# Patient Record
Sex: Male | Born: 1955 | Race: White | Hispanic: No | Marital: Married | Smoking: Never smoker
Health system: Southern US, Community
[De-identification: ages and names within clinical notes are randomized; demographics above are authoritative.]

## PROBLEM LIST (undated history)

## (undated) DIAGNOSIS — T8859XA Other complications of anesthesia, initial encounter: Secondary | ICD-10-CM

## (undated) DIAGNOSIS — I10 Essential (primary) hypertension: Secondary | ICD-10-CM

## (undated) DIAGNOSIS — T7840XA Allergy, unspecified, initial encounter: Secondary | ICD-10-CM

## (undated) DIAGNOSIS — D494 Neoplasm of unspecified behavior of bladder: Secondary | ICD-10-CM

## (undated) DIAGNOSIS — L719 Rosacea, unspecified: Secondary | ICD-10-CM

## (undated) DIAGNOSIS — E785 Hyperlipidemia, unspecified: Secondary | ICD-10-CM

## (undated) DIAGNOSIS — Z973 Presence of spectacles and contact lenses: Secondary | ICD-10-CM

## (undated) HISTORY — PX: UPPER GASTROINTESTINAL ENDOSCOPY: SHX188

## (undated) HISTORY — PX: TONSILLECTOMY: SUR1361

## (undated) HISTORY — DX: Allergy, unspecified, initial encounter: T78.40XA

## (undated) HISTORY — PX: OTHER SURGICAL HISTORY: SHX169

## (undated) HISTORY — DX: Hyperlipidemia, unspecified: E78.5

## (undated) HISTORY — DX: Rosacea, unspecified: L71.9

## (undated) HISTORY — DX: Essential (primary) hypertension: I10

## (undated) HISTORY — PX: COLONOSCOPY: SHX174

## (undated) HISTORY — PX: PILONIDAL CYST EXCISION: SHX744

---

## 1998-12-26 ENCOUNTER — Ambulatory Visit (HOSPITAL_COMMUNITY): Admission: RE | Admit: 1998-12-26 | Discharge: 1998-12-26 | Payer: Self-pay | Admitting: Neurosurgery

## 1998-12-26 ENCOUNTER — Encounter: Payer: Self-pay | Admitting: Neurosurgery

## 1999-01-03 ENCOUNTER — Encounter: Payer: Self-pay | Admitting: Neurosurgery

## 1999-01-05 ENCOUNTER — Inpatient Hospital Stay (HOSPITAL_COMMUNITY): Admission: RE | Admit: 1999-01-05 | Discharge: 1999-01-06 | Payer: Self-pay | Admitting: Neurosurgery

## 1999-01-05 ENCOUNTER — Encounter: Payer: Self-pay | Admitting: Neurosurgery

## 2000-08-14 DIAGNOSIS — Z87442 Personal history of urinary calculi: Secondary | ICD-10-CM

## 2000-08-14 HISTORY — DX: Personal history of urinary calculi: Z87.442

## 2002-08-14 HISTORY — PX: UPPER GASTROINTESTINAL ENDOSCOPY: SHX188

## 2002-12-25 ENCOUNTER — Encounter: Admission: RE | Admit: 2002-12-25 | Discharge: 2002-12-25 | Payer: Self-pay | Admitting: Family Medicine

## 2002-12-25 ENCOUNTER — Encounter: Payer: Self-pay | Admitting: Family Medicine

## 2004-05-25 ENCOUNTER — Encounter: Admission: RE | Admit: 2004-05-25 | Discharge: 2004-05-25 | Payer: Self-pay | Admitting: Family Medicine

## 2008-05-21 ENCOUNTER — Ambulatory Visit: Payer: Self-pay | Admitting: Gastroenterology

## 2008-06-01 ENCOUNTER — Telehealth: Payer: Self-pay | Admitting: Gastroenterology

## 2008-06-03 ENCOUNTER — Ambulatory Visit: Payer: Self-pay | Admitting: Gastroenterology

## 2010-09-15 NOTE — Miscellaneous (Signed)
Summary: RECALL COL '08.Marland KitchenEM  Clinical Lists Changes  Medications: Added new medication of MOVIPREP 100 GM  SOLR (PEG-KCL-NACL-NASULF-NA ASC-C) As per prep instructions. - Signed Rx of MOVIPREP 100 GM  SOLR (PEG-KCL-NACL-NASULF-NA ASC-C) As per prep instructions.;  #1 x 0;  Signed;  Entered by: Clide Cliff RN;  Authorized by: Mardella Layman MD [facility];  Method used: Electronically to [facility]*, [address] [place], [place]  [id number], Ph: [phone], Fax: [phone] Observations: Added new observation of NKA: T (05/21/2008 8:01)    Prescriptions: MOVIPREP 100 GM  SOLR (PEG-KCL-NACL-NASULF-NA ASC-C) As per prep instructions.  #1 x 0   Entered by:   Clide Cliff RN   Authorized by:   Mardella Layman MD [facility]   Signed by:   Clide Cliff RN on 05/21/2008   Method used:   Electronically to        [facility]* (retail)       [address]       [place], [place]  [id number]       Ph: [phone]       Fax: [phone]   RxID:   [id number]

## 2010-09-15 NOTE — Progress Notes (Signed)
Summary: ? MEDS  Phone Note Call from Patient Call back at [phone]   Caller: Patient Call For: PATTERSON Reason for Call: Talk to Nurse Details for Reason: ? MEDS Summary of Call: proc sch on Wed pt has ? re: his meds Initial call taken by: Guadlupe Spanish [facility],  June 01, 2008 10:06 AM  Follow-up for Phone Call        pt questioned if he should stop taking his lipitor and lisinopril. I advised him to continue taking those meds and that he should take them the day of the procedure, just NPO after 9am so take them before. Follow-up by: Harlow Mares CMA,  June 01, 2008 10:11 AM

## 2010-09-15 NOTE — Procedures (Signed)
Summary: Colonoscopy   Colonoscopy  Procedure date:  06/03/2008  Findings:      Location:  [facility].    Procedures Next Due Date:    Colonoscopy: 06/2018  Patient Name: [name] [name]. MRN:  Procedure Procedures: Colonoscopy CPT: [id number].  Personnel: Endoscopist: Vania Rea. Jarold Motto, MD.  Exam Location: Exam performed in Outpatient Clinic. Outpatient  Patient Consent: Procedure, Alternatives, Risks and Benefits discussed, consent obtained, from patient. Consent was obtained by the RN.  Indications  Average Risk Screening Routine.  History  Current Medications: Patient is not currently taking Coumadin.  Medical/ Surgical History: Hypertension,  Pre-Exam Physical: Performed Jun 03, 2008. Cardio-pulmonary exam, Rectal exam, Abdominal exam, Extremity exam, Mental status exam WNL.  Comments: Pt. history reviewed/updated, physical exam performed prior to initiation of sedation? yes Exam Exam: Extent of exam reached: Cecum, extent intended: Cecum.  The cecum was identified by appendiceal orifice and IC valve. Patient position: on left side. Time to Cecum: 00:04:06. Time for Withdrawl: 00:05:05. Colon retroflexion performed. Images taken. ASA Classification: II. Tolerance: excellent.  Monitoring: Pulse and BP monitoring, Oximetry used. Supplemental O2 given. at 2 Liters.  Colon Prep Used Golytely for colon prep. Prep results: excellent.  Sedation Meds: Patient assessed and found to be appropriate for moderate (conscious) sedation. Sedation was managed by the Endoscopist. Fentanyl 50 mcg. given IV. Versed 6 mg. given IV.  Instrument(s): CF 140L. Serial [id number].  Findings - NORMAL EXAM: Cecum to Rectum. Not Seen: Polyps. AVM's. Colitis. Tumors. Melanosis. Crohn's. Diverticulosis. Hemorrhoids.   Assessment Normal examination.  Events  Unplanned Interventions: No intervention was required.  Plans Medication Plan: Referring provider to order  medications.  Patient Education: Patient given standard instructions for: Patient instructed to get routine colonoscopy every 10 years.  Disposition: After procedure patient sent to recovery. After recovery patient sent home.  Scheduling/Referral: Follow-Up prn.    cc: Hall Busing, MD  This report was created from the original endoscopy report, which was reviewed and signed by the above listed endoscopist.

## 2011-11-09 ENCOUNTER — Encounter: Payer: Self-pay | Admitting: Gastroenterology

## 2013-04-29 ENCOUNTER — Other Ambulatory Visit: Payer: Self-pay | Admitting: Family Medicine

## 2013-04-29 ENCOUNTER — Ambulatory Visit
Admission: RE | Admit: 2013-04-29 | Discharge: 2013-04-29 | Disposition: A | Discharge status: Home / Self Care | Payer: BC Managed Care – PPO | Source: Ambulatory Visit | Attending: Family Medicine | Admitting: Family Medicine

## 2013-04-29 DIAGNOSIS — M542 Cervicalgia: Secondary | ICD-10-CM

## 2013-06-18 ENCOUNTER — Ambulatory Visit: Payer: 59 | Attending: Family Medicine

## 2013-06-18 DIAGNOSIS — R5381 Other malaise: Secondary | ICD-10-CM | POA: Insufficient documentation

## 2013-06-18 DIAGNOSIS — M542 Cervicalgia: Secondary | ICD-10-CM | POA: Insufficient documentation

## 2013-06-18 DIAGNOSIS — IMO0001 Reserved for inherently not codable concepts without codable children: Secondary | ICD-10-CM | POA: Insufficient documentation

## 2013-06-20 ENCOUNTER — Ambulatory Visit: Payer: 59 | Admitting: Physical Therapy

## 2013-06-25 ENCOUNTER — Ambulatory Visit: Payer: 59 | Admitting: Physical Therapy

## 2013-06-27 ENCOUNTER — Ambulatory Visit: Payer: 59 | Admitting: Physical Therapy

## 2013-07-01 ENCOUNTER — Ambulatory Visit: Payer: 59

## 2013-07-04 ENCOUNTER — Encounter: Payer: 59 | Admitting: Physical Therapy

## 2013-07-07 ENCOUNTER — Ambulatory Visit: Payer: 59

## 2013-07-09 ENCOUNTER — Ambulatory Visit: Payer: 59 | Admitting: Physical Therapy

## 2013-07-14 ENCOUNTER — Ambulatory Visit: Payer: 59 | Attending: Family Medicine

## 2013-07-14 DIAGNOSIS — R5381 Other malaise: Secondary | ICD-10-CM | POA: Insufficient documentation

## 2013-07-14 DIAGNOSIS — IMO0001 Reserved for inherently not codable concepts without codable children: Secondary | ICD-10-CM | POA: Insufficient documentation

## 2013-07-14 DIAGNOSIS — M542 Cervicalgia: Secondary | ICD-10-CM | POA: Insufficient documentation

## 2013-07-16 ENCOUNTER — Ambulatory Visit: Payer: 59

## 2013-07-23 ENCOUNTER — Ambulatory Visit: Payer: 59 | Admitting: Physical Therapy

## 2013-07-24 ENCOUNTER — Ambulatory Visit: Payer: 59

## 2013-07-28 ENCOUNTER — Ambulatory Visit: Payer: 59

## 2013-07-30 ENCOUNTER — Ambulatory Visit: Payer: 59 | Admitting: Physical Therapy

## 2015-01-25 IMAGING — CR DG CERVICAL SPINE COMPLETE 4+V
6 series · 6 of 6 positions shown · non-contrast
Comparison: None.

***ADDENDUM*** CREATED: 04/29/2013 [DATE]

The anterior cervical fusion extends through T1 level.
***END ADDENDUM*** SIGNED BY: Kae Rehm
CLINICAL DATA: History of previous injury with pain. Injury on
04/25/2013.  History of previous spinal surgery.
CERVICAL SPINE - COMPLETE 4+ VIEW

[w c-spine lat]
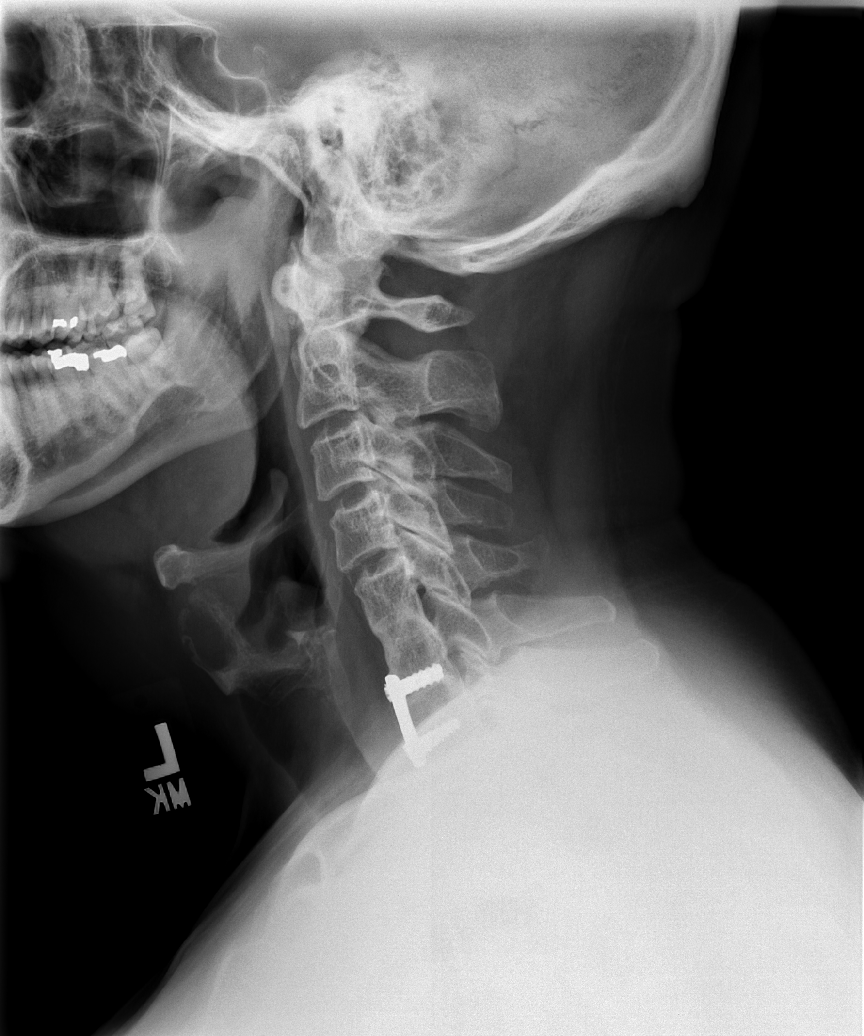

[w c-spine oblique (1 of 2)]
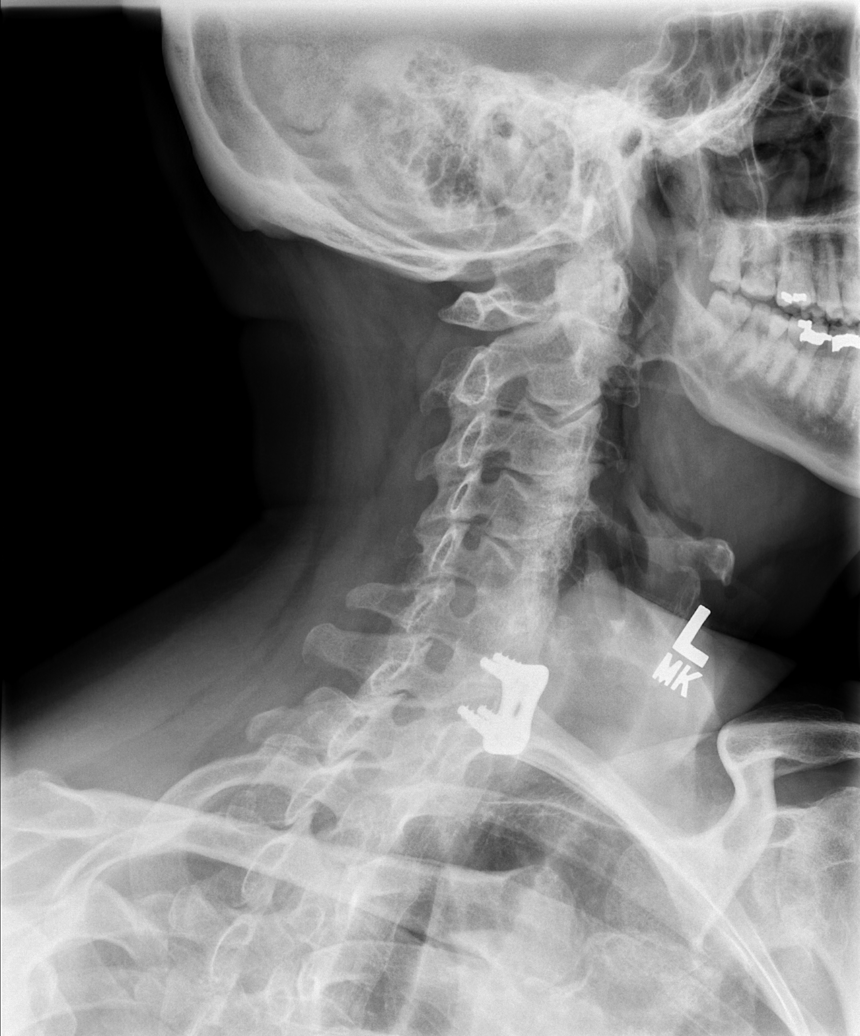

[w c-spine oblique (2 of 2)]
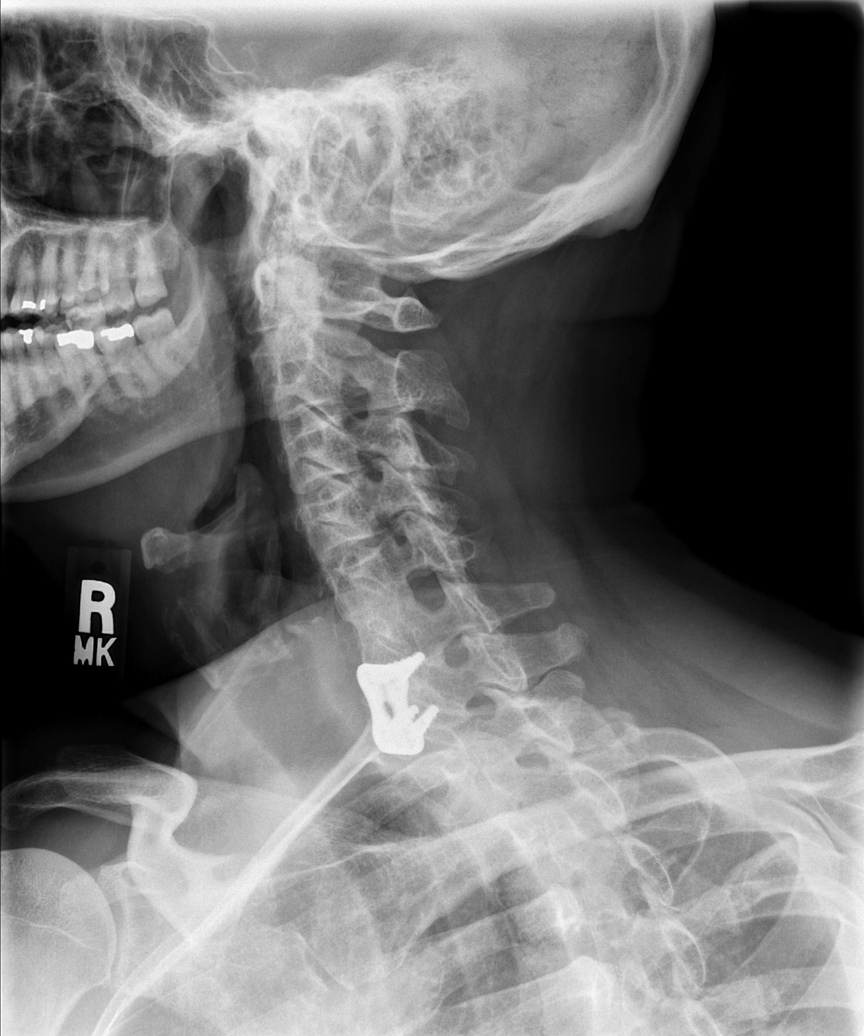

[w c-spine a.p. *]
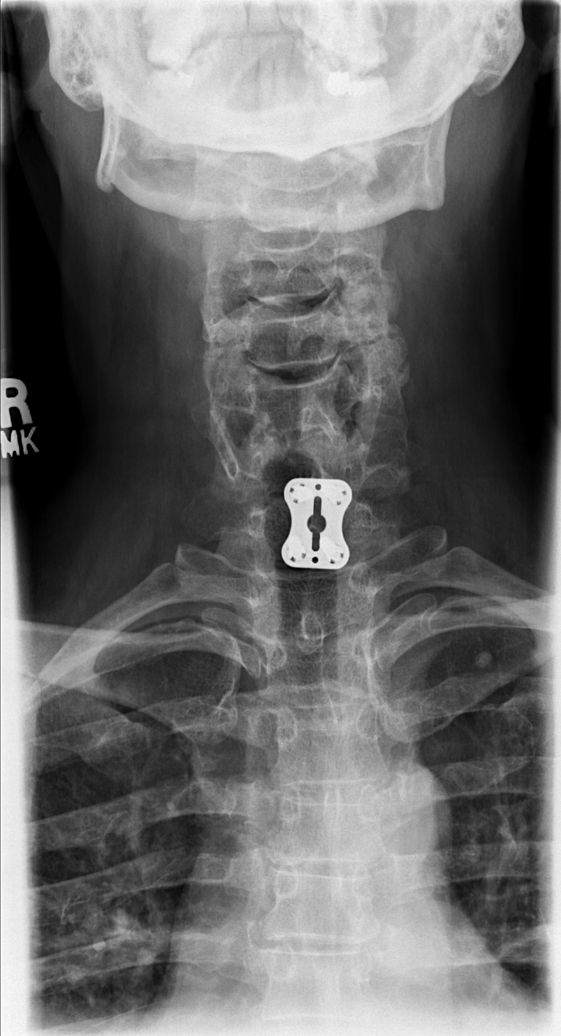

[w c-spine odontoid *]
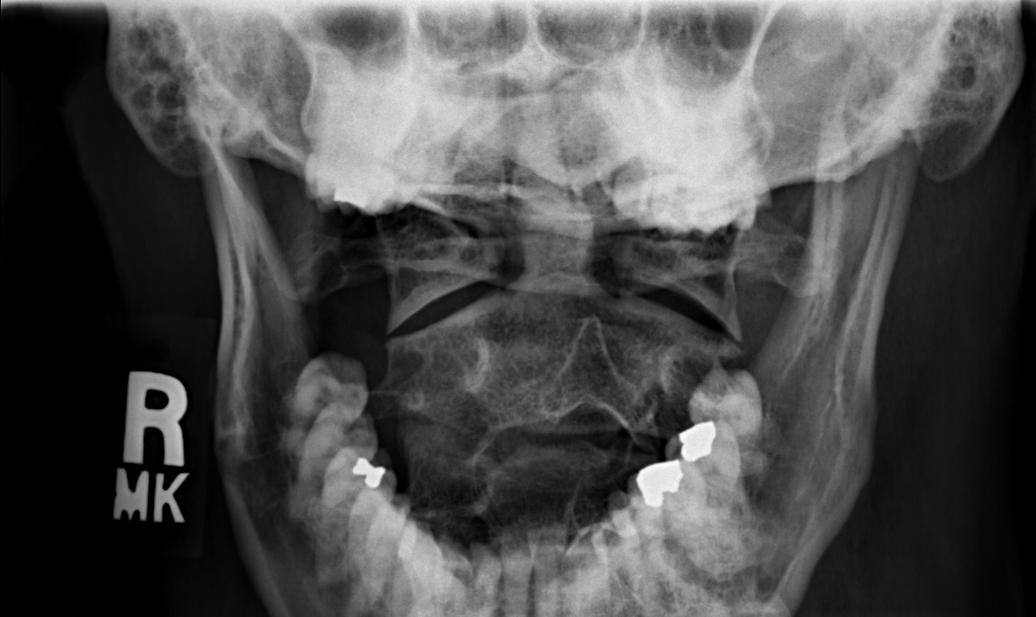

[w swimmers view *]
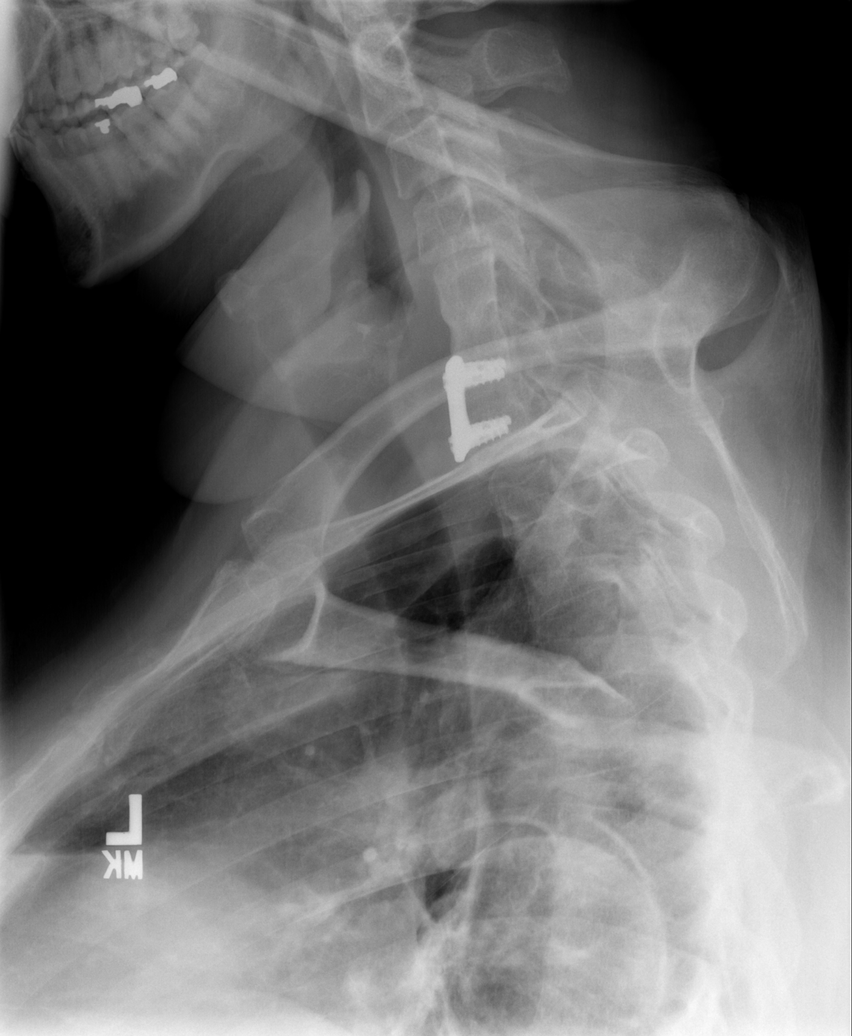

[6 of 6 positions shown; findings below may reference images not displayed]

FINDINGS: No prevertebral soft tissue swelling is seen.  There is
fusion of vertebral bodies extending from C5-C7.  There is no
evidence of disruption of the metallic hardware.  There is no
evidence of fracture.  No subluxation or dislocation is evident.
Intervertebral disc spaces are preserved at levels of C2-C3, C3-C4,
and C4-C5.  There is uncovertebral spurring with minimal foraminal
encroachment at levels of C3-C4 and C4-C5 on the right and
minimally on the left.  There is some apophyseal joint degenerative
spondylosis.  No cervical rib is evident.
IMPRESSION: No evidence of fracture, subluxation, or dislocation.  Stable
appearance of vertebral fusion from C5-C7.  No disruption of
hardware is evident.  Degenerative spondylosis changes are
described above.

## 2015-07-23 ENCOUNTER — Encounter: Payer: Self-pay | Admitting: Gastroenterology

## 2017-11-27 ENCOUNTER — Encounter: Payer: Self-pay | Admitting: Gastroenterology

## 2018-05-17 ENCOUNTER — Encounter: Payer: Self-pay | Admitting: Gastroenterology

## 2018-06-18 ENCOUNTER — Encounter: Payer: Self-pay | Admitting: Gastroenterology

## 2018-06-18 ENCOUNTER — Ambulatory Visit (AMBULATORY_SURGERY_CENTER): Payer: Self-pay

## 2018-06-18 VITALS — Ht 70.0 in | Wt 243.3 lb

## 2018-06-18 DIAGNOSIS — Z1211 Encounter for screening for malignant neoplasm of colon: Secondary | ICD-10-CM

## 2018-06-18 MED ORDER — NA SULFATE-K SULFATE-MG SULF 17.5-3.13-1.6 GM/177ML PO SOLN: 1.0000 | Freq: Once | ORAL | 0 refills | Status: AC

## 2018-06-18 NOTE — Progress Notes (Signed)
Denies allergies to eggs or soy products. Denies complication of anesthesia or sedation. Denies use of weight loss medication. Denies use of O2.   Emmi instructions declined.   A pay no more than 50.00 coupon for Suprep was given to the patient.  

## 2018-07-02 ENCOUNTER — Ambulatory Visit (AMBULATORY_SURGERY_CENTER): Payer: PRIVATE HEALTH INSURANCE | Admitting: Gastroenterology

## 2018-07-02 ENCOUNTER — Encounter: Payer: Self-pay | Admitting: Gastroenterology

## 2018-07-02 VITALS — BP 126/75 | HR 58 | Temp 97.5°F | Resp 16 | Ht 70.0 in | Wt 243.0 lb

## 2018-07-02 DIAGNOSIS — D122 Benign neoplasm of ascending colon: Secondary | ICD-10-CM | POA: Diagnosis not present

## 2018-07-02 DIAGNOSIS — D124 Benign neoplasm of descending colon: Secondary | ICD-10-CM | POA: Diagnosis not present

## 2018-07-02 DIAGNOSIS — D123 Benign neoplasm of transverse colon: Secondary | ICD-10-CM

## 2018-07-02 DIAGNOSIS — D125 Benign neoplasm of sigmoid colon: Secondary | ICD-10-CM

## 2018-07-02 DIAGNOSIS — K635 Polyp of colon: Secondary | ICD-10-CM

## 2018-07-02 DIAGNOSIS — D128 Benign neoplasm of rectum: Secondary | ICD-10-CM

## 2018-07-02 DIAGNOSIS — Z1211 Encounter for screening for malignant neoplasm of colon: Secondary | ICD-10-CM

## 2018-07-02 DIAGNOSIS — D120 Benign neoplasm of cecum: Secondary | ICD-10-CM

## 2018-07-02 MED ORDER — SODIUM CHLORIDE 0.9 % IV SOLN: 500.0000 mL | Freq: Once | INTRAVENOUS | Status: DC

## 2018-07-02 NOTE — Progress Notes (Signed)
Report given to PACU, vss 

## 2018-07-02 NOTE — Patient Instructions (Signed)
YOU HAD AN ENDOSCOPIC PROCEDURE TODAY AT THE [facility]:   Refer to the procedure report that was given to you for any specific questions about what was found during the examination.  If the procedure report does not answer your questions, please call your gastroenterologist to clarify.  If you requested that your care partner not be given the details of your procedure findings, then the procedure report has been included in a sealed envelope for you to review at your convenience later.  YOU SHOULD EXPECT: Some feelings of bloating in the abdomen. Passage of more gas than usual.  Walking can help get rid of the air that was put into your GI tract during the procedure and reduce the bloating. If you had a lower endoscopy (such as a colonoscopy or flexible sigmoidoscopy) you may notice spotting of blood in your stool or on the toilet paper. If you underwent a bowel prep for your procedure, you may not have a normal bowel movement for a few days.  Please Note:  You might notice some irritation and congestion in your nose or some drainage.  This is from the oxygen used during your procedure.  There is no need for concern and it should clear up in a day or so.  SYMPTOMS TO REPORT IMMEDIATELY:   Following lower endoscopy (colonoscopy or flexible sigmoidoscopy):  Excessive amounts of blood in the stool  Significant tenderness or worsening of abdominal pains  Swelling of the abdomen that is new, acute  Fever of 100F or higher   For urgent or emergent issues, a gastroenterologist can be reached at any hour by calling [phone].   DIET:  We do recommend a small meal at first, but then you may proceed to your regular diet.  Drink plenty of fluids but you should avoid alcoholic beverages for 24 hours.  ACTIVITY:  You should plan to take it easy for the rest of today and you should NOT DRIVE or use heavy machinery until tomorrow (because of the sedation medicines used during the test).     FOLLOW UP: Our staff will call the number listed on your records the next business day following your procedure to check on you and address any questions or concerns that you may have regarding the information given to you following your procedure. If we do not reach you, we will leave a message.  However, if you are feeling well and you are not experiencing any problems, there is no need to return our call.  We will assume that you have returned to your regular daily activities without incident.  If any biopsies were taken you will be contacted by phone or by letter within the next 1-3 weeks.  Please call us at [phone] if you have not heard about the biopsies in 3 weeks.    SIGNATURES/CONFIDENTIALITY: You and/or your care partner have signed paperwork which will be entered into your electronic medical record.  These signatures attest to the fact that that the information above on your After Visit Summary has been reviewed and is understood.  Full responsibility of the confidentiality of this discharge information lies with you and/or your care-partner.    Handouts were given to your care partner on polyps and hemorrhoids. You may resume your current medications today. Await biopsy results. Please call if any questions or concerns.   

## 2018-07-02 NOTE — Op Note (Addendum)
[place] Patient Name: [name] [name] Procedure Date: 07/02/2018 9:03 AM MRN: [id number] Endoscopist: Remo Lipps P. Havery Moros , MD Age: 62 Referring MD:  Date of Birth: 1956/03/01 Gender: Male Account #: [id number] Procedure:                Colonoscopy Indications:              Screening for colorectal malignant neoplasm Medicines:                Monitored Anesthesia Care Procedure:                Pre-Anesthesia Assessment:                           - Prior to the procedure, a History and Physical                            was performed, and patient medications and                            allergies were reviewed. The patient's tolerance of                            previous anesthesia was also reviewed. The risks                            and benefits of the procedure and the sedation                            options and risks were discussed with the patient.                            All questions were answered, and informed consent                            was obtained. Prior Anticoagulants: The patient has                            taken no previous anticoagulant or antiplatelet                            agents. ASA Grade Assessment: II - A patient with                            mild systemic disease. After reviewing the risks                            and benefits, the patient was deemed in                            satisfactory condition to undergo the procedure.                           After obtaining informed consent, the colonoscope  was passed under direct vision. Throughout the                            procedure, the patient's blood pressure, pulse, and                            oxygen saturations were monitored continuously. The                            Colonoscope was introduced through the anus and                            advanced to the the cecum, identified by                            appendiceal orifice and  ileocecal valve. The                            colonoscopy was performed without difficulty. The                            patient tolerated the procedure well. The quality                            of the bowel preparation was good. The ileocecal                            valve, appendiceal orifice, and rectum were                            photographed. Scope In: 9:08:00 AM Scope Out: 9:41:17 AM Scope Withdrawal Time: 0 hours 29 minutes 33 seconds  Total Procedure Duration: 0 hours 33 minutes 17 seconds  Findings:                 The perianal and digital rectal examinations were                            normal.                           Two sessile polyps were found in the cecum. The                            polyps were 3 to 4 mm in size. These polyps were                            removed with a cold snare. Resection and retrieval                            were complete.                           Two sessile polyps were found in the ascending  colon. The polyps were 3 mm in size. These polyps                            were removed with a cold snare. Resection and                            retrieval were complete.                           A 3 mm polyp was found in the hepatic flexure. The                            polyp was sessile and could not be grasped with the                            snare. The polyp was removed with a cold biopsy                            forceps. Resection and retrieval were complete.                           Three sessile polyps were found in the transverse                            colon. The polyps were 3 to 5 mm in size. These                            polyps were removed with a cold snare. Resection                            and retrieval were complete.                           Two sessile polyps were found in the splenic                            flexure. The polyps were 3 mm in size. These polyps                             were removed with a cold snare. Resection and                            retrieval were complete.                           A 3 mm polyp was found in the descending colon. The                            polyp was sessile. The polyp was removed with a                            cold snare. Resection and retrieval were complete.  A 3 mm polyp was found in the sigmoid colon. The                            polyp was sessile. The polyp was removed with a                            cold snare. Resection and retrieval were complete.                           A 3 mm polyp was found in the rectum. The polyp was                            sessile. The polyp was removed with a cold snare.                            Resection and retrieval were complete.                           Internal hemorrhoids were found during retroflexion.                           The exam was otherwise without abnormality. Complications:            No immediate complications. Estimated blood loss:                            Minimal. Estimated Blood Loss:     Estimated blood loss was minimal. Impression:               - Two 3 to 4 mm polyps in the cecum, removed with a                            cold snare. Resected and retrieved.                           - Two 3 mm polyps in the ascending colon, removed                            with a cold snare. Resected and retrieved.                           - One 3 mm polyp at the hepatic flexure, removed                            with a cold biopsy forceps. Resected and retrieved.                           - Three 3 to 5 mm polyps in the transverse colon,                            removed with a cold snare. Resected and retrieved.                           -  Two 3 mm polyps at the splenic flexure, removed                            with a cold snare. Resected and retrieved.                           - One 3 mm polyp in the descending  colon, removed                            with a cold snare. Resected and retrieved.                           - One 3 mm polyp in the sigmoid colon, removed with                            a cold snare. Resected and retrieved.                           - One 3 mm polyp in the rectum, removed with a cold                            snare. Resected and retrieved.                           - Internal hemorrhoids.                           - The examination was otherwise normal. Recommendation:           - Patient has a contact number available for                            emergencies. The signs and symptoms of potential                            delayed complications were discussed with the                            patient. Return to normal activities tomorrow.                            Written discharge instructions were provided to the                            patient.                           - Resume previous diet.                           - Continue present medications.                           - Await pathology results. Remo Lipps P. Armbruster, MD 07/02/2018 9:46:54 AM This report has been signed electronically.

## 2018-07-02 NOTE — Progress Notes (Signed)
Called to room to assist during endoscopic procedure.  Patient ID and intended procedure confirmed with present staff. Received instructions for my participation in the procedure from the performing physician.  

## 2018-07-02 NOTE — Progress Notes (Signed)
Pt's states no medical or surgical changes since previsit or office visit. 

## 2018-07-02 NOTE — Progress Notes (Signed)
Pt is waiting to speak with Dr. Havery Moros d/t pt being asleep 2x that Dr. Havery Moros came in to speak with him about his results.  maw

## 2018-07-02 NOTE — Progress Notes (Signed)
No problems noted in the recovery room. maw 

## 2018-07-03 ENCOUNTER — Telehealth: Payer: Self-pay | Admitting: *Deleted

## 2018-07-03 NOTE — Telephone Encounter (Signed)
No answer, left message to call if questions or concerns. 

## 2018-07-03 NOTE — Telephone Encounter (Signed)
No answer, second call, left message to call if questions or concerns.

## 2018-07-08 ENCOUNTER — Encounter: Payer: Self-pay | Admitting: Gastroenterology

## 2019-06-12 ENCOUNTER — Encounter: Payer: Self-pay | Admitting: Gastroenterology

## 2019-06-25 ENCOUNTER — Encounter: Payer: Self-pay | Admitting: Gastroenterology

## 2019-07-15 HISTORY — PX: COLONOSCOPY: SHX174

## 2019-07-22 ENCOUNTER — Ambulatory Visit (AMBULATORY_SURGERY_CENTER): Payer: Medicare Other

## 2019-07-22 ENCOUNTER — Other Ambulatory Visit: Payer: Self-pay

## 2019-07-22 ENCOUNTER — Encounter: Payer: Self-pay | Admitting: Gastroenterology

## 2019-07-22 VITALS — Temp 96.0°F | Ht 70.0 in | Wt 237.6 lb

## 2019-07-22 DIAGNOSIS — Z1159 Encounter for screening for other viral diseases: Secondary | ICD-10-CM

## 2019-07-22 DIAGNOSIS — Z86010 Personal history of colonic polyps: Secondary | ICD-10-CM

## 2019-07-22 MED ORDER — NA SULFATE-K SULFATE-MG SULF 17.5-3.13-1.6 GM/177ML PO SOLN: 1.0000 | Freq: Once | ORAL | 0 refills | Status: AC

## 2019-07-22 NOTE — Progress Notes (Signed)

## 2019-07-31 ENCOUNTER — Other Ambulatory Visit: Payer: Self-pay | Admitting: Gastroenterology

## 2019-07-31 ENCOUNTER — Ambulatory Visit (INDEPENDENT_AMBULATORY_CARE_PROVIDER_SITE_OTHER): Payer: Medicare Other

## 2019-07-31 DIAGNOSIS — Z1159 Encounter for screening for other viral diseases: Secondary | ICD-10-CM

## 2019-07-31 LAB — SARS CORONAVIRUS 2 (TAT 6-24 HRS): SARS Coronavirus 2: NEGATIVE

## 2019-08-05 ENCOUNTER — Other Ambulatory Visit: Payer: Self-pay

## 2019-08-05 ENCOUNTER — Encounter: Payer: Self-pay | Admitting: Gastroenterology

## 2019-08-05 ENCOUNTER — Ambulatory Visit (AMBULATORY_SURGERY_CENTER): Payer: Medicare Other | Admitting: Gastroenterology

## 2019-08-05 VITALS — BP 126/78 | HR 59 | Temp 98.6°F | Resp 8 | Ht 70.0 in | Wt 237.6 lb

## 2019-08-05 DIAGNOSIS — Z86010 Personal history of colonic polyps: Secondary | ICD-10-CM

## 2019-08-05 DIAGNOSIS — D122 Benign neoplasm of ascending colon: Secondary | ICD-10-CM

## 2019-08-05 MED ORDER — SODIUM CHLORIDE 0.9 % IV SOLN: 500.0000 mL | Freq: Once | INTRAVENOUS | Status: DC

## 2019-08-05 NOTE — Op Note (Signed)
[place] Patient Name: [name] [name] Procedure Date: 08/05/2019 11:03 AM MRN: [id number] Endoscopist: Remo Lipps P. 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 The patient's tolerance of                            previous anesthesia was also reviewed. The risks                            and benefits of the procedure and the sedation                            options and risks were discussed with the patient.                            All questions were answered, and informed consent                            was obtained. Prior Anticoagulants: The patient has                            taken no previous anticoagulant or antiplatelet                            agents. ASA Grade Assessment: II - A patient with                            mild systemic disease. After reviewing the risks                            and benefits, the patient was deemed in                            satisfactory condition to undergo the procedure.  After obtaining informed consent, the colonoscope                            was passed under direct vision. Throughout the                            procedure, the patient's blood pressure, pulse, and                            oxygen saturations were monitored continuously. The                            Colonoscope was introduced through the anus and                            advanced to the the cecum,  identified by                            appendiceal orifice and ileocecal valve. The                            colonoscopy was performed without difficulty. The                            patient tolerated the procedure well. The quality                            of the bowel preparation was good. The ileocecal                            valve, appendiceal orifice, and rectum were                            photographed. Scope In: 11:07:35 AM Scope Out: 11:28:13 AM Scope Withdrawal Time: 0 hours 15 minutes 56 seconds  Total Procedure Duration: 0 hours 20 minutes 38 seconds  Findings:                 The perianal and digital rectal examinations were                            normal.                           A diminutive polyp was found in the ascending                            colon. The polyp was sessile. The polyp was removed                            with a cold snare. Resection and retrieval were                            complete.  A few small-mouthed diverticula were found in the                            sigmoid colon.                           Internal hemorrhoids were found during retroflexion.                           The exam was otherwise without abnormality. Complications:            No immediate complications. Estimated blood loss:                            Minimal. Estimated Blood Loss:     Estimated blood loss was minimal. Impression:               - One diminutive polyp in the ascending colon,                            removed with a cold snare. Resected and retrieved.                           - Diverticulosis in the sigmoid colon.                           - Internal hemorrhoids.                           - The examination was otherwise normal. Recommendation:           - Patient has a contact number available for                            emergencies. The signs and symptoms of potential                            delayed  complications were discussed with the                            patient. Return to normal activities tomorrow.                            Written discharge instructions were provided to the                            patient.                           - Resume previous diet.                           - Continue present medications.                           - Await pathology results with further  recommendations [name] [name]. [name] Schumacher, MD 08/05/2019 11:31:55 AM This report has been signed electronically.

## 2019-08-05 NOTE — Progress Notes (Signed)
Temp by JB, VS by CW  Pt's states no medical or surgical changes since previsit or office visit.

## 2019-08-05 NOTE — Progress Notes (Signed)
Report given to PACU, vss 

## 2019-08-05 NOTE — Patient Instructions (Signed)
Handouts given:  Hemorrhoids, diverticulosis, Polyps Continue current medications Resume previous diet Await pathology results    YOU HAD AN ENDOSCOPIC PROCEDURE TODAY AT [facility]:   Refer to the procedure report that was given to you for any specific questions about what was found during the examination.  If the procedure report does not answer your questions, please call your gastroenterologist to clarify.  If you requested that your care partner not be given the details of your procedure findings, then the procedure report has been included in a sealed envelope for you to review at your convenience later.  YOU SHOULD EXPECT: Some feelings of bloating in the abdomen. Passage of more gas than usual.  Walking can help get rid of the air that was put into your GI tract during the procedure and reduce the bloating. If you had a lower endoscopy (such as a colonoscopy or flexible sigmoidoscopy) you may notice spotting of blood in your stool or on the toilet paper. If you underwent a bowel prep for your procedure, you may not have a normal bowel movement for a few days.  Please Note:  You might notice some irritation and congestion in your nose or some drainage.  This is from the oxygen used during your procedure.  There is no need for concern and it should clear up in a day or so.  SYMPTOMS TO REPORT IMMEDIATELY:   Following lower endoscopy (colonoscopy or flexible sigmoidoscopy):  Excessive amounts of blood in the stool  Significant tenderness or worsening of abdominal pains  Swelling of the abdomen that is new, acute  Fever of 100F or higher  For urgent or emergent issues, a gastroenterologist can be reached at any hour by calling [phone].   DIET:  We do recommend a small meal at first, but then you may proceed to your regular diet.  Drink plenty of fluids but you should avoid alcoholic beverages for 24 hours.  ACTIVITY:  You should plan to take it easy for the  rest of today and you should NOT DRIVE or use heavy machinery until tomorrow (because of the sedation medicines used during the test).    FOLLOW UP: Our staff will call the number listed on your records 48-72 hours following your procedure to check on you and address any questions or concerns that you may have regarding the information given to you following your procedure. If we do not reach you, we will leave a message.  We will attempt to reach you two times.  During this call, we will ask if you have developed any symptoms of COVID 19. If you develop any symptoms (ie: fever, flu-like symptoms, shortness of breath, cough etc.) before then, please call [phone].  If you test positive for Covid 19 in the 2 weeks post procedure, please call and report this information to [place].    If any biopsies were taken you will be contacted by phone or by letter within the next 1-3 weeks.  Please call us at [phone] if you have not heard about the biopsies in 3 weeks.    SIGNATURES/CONFIDENTIALITY: You and/or your care partner have signed paperwork which will be entered into your electronic medical record.  These signatures attest to the fact that that the information above on your After Visit Summary has been reviewed and is understood.  Full responsibility of the confidentiality of this discharge information lies with you and/or your care-partner.

## 2019-08-05 NOTE — Progress Notes (Signed)
Called to room to assist during endoscopic procedure.  Patient ID and intended procedure confirmed with present staff. Received instructions for my participation in the procedure from the performing physician.  

## 2019-08-07 ENCOUNTER — Telehealth: Payer: Self-pay | Admitting: *Deleted

## 2019-08-07 NOTE — Telephone Encounter (Signed)
No answer for post procedure call back. Left message for patient to call with questions or concerns. 

## 2019-08-07 NOTE — Telephone Encounter (Signed)
  Follow up Call-  Call back number 08/05/2019 07/02/2018  Post procedure Call Back phone  # [phone]  Permission to leave phone message Yes Yes  Some recent data might be hidden     No answer at # given.  Lm on VM

## 2019-08-14 ENCOUNTER — Encounter: Payer: Self-pay | Admitting: Gastroenterology

## 2020-09-01 DIAGNOSIS — D485 Neoplasm of uncertain behavior of skin: Secondary | ICD-10-CM | POA: Diagnosis not present

## 2020-09-01 DIAGNOSIS — L821 Other seborrheic keratosis: Secondary | ICD-10-CM | POA: Diagnosis not present

## 2020-09-01 DIAGNOSIS — L570 Actinic keratosis: Secondary | ICD-10-CM | POA: Diagnosis not present

## 2020-09-01 DIAGNOSIS — D225 Melanocytic nevi of trunk: Secondary | ICD-10-CM | POA: Diagnosis not present

## 2020-09-01 DIAGNOSIS — D2271 Melanocytic nevi of right lower limb, including hip: Secondary | ICD-10-CM | POA: Diagnosis not present

## 2020-09-01 DIAGNOSIS — L820 Inflamed seborrheic keratosis: Secondary | ICD-10-CM | POA: Diagnosis not present

## 2020-09-01 DIAGNOSIS — L578 Other skin changes due to chronic exposure to nonionizing radiation: Secondary | ICD-10-CM | POA: Diagnosis not present

## 2020-09-01 DIAGNOSIS — D171 Benign lipomatous neoplasm of skin and subcutaneous tissue of trunk: Secondary | ICD-10-CM | POA: Diagnosis not present

## 2020-09-01 DIAGNOSIS — D2261 Melanocytic nevi of right upper limb, including shoulder: Secondary | ICD-10-CM | POA: Diagnosis not present

## 2020-09-08 DIAGNOSIS — Z23 Encounter for immunization: Secondary | ICD-10-CM | POA: Diagnosis not present

## 2020-09-17 DIAGNOSIS — R310 Gross hematuria: Secondary | ICD-10-CM | POA: Diagnosis not present

## 2020-09-17 DIAGNOSIS — R140 Abdominal distension (gaseous): Secondary | ICD-10-CM | POA: Diagnosis not present

## 2020-10-21 DIAGNOSIS — R310 Gross hematuria: Secondary | ICD-10-CM | POA: Diagnosis not present

## 2020-11-01 DIAGNOSIS — C670 Malignant neoplasm of trigone of bladder: Secondary | ICD-10-CM | POA: Diagnosis not present

## 2020-11-03 ENCOUNTER — Other Ambulatory Visit: Payer: Self-pay | Admitting: Urology

## 2020-11-17 ENCOUNTER — Encounter (HOSPITAL_BASED_OUTPATIENT_CLINIC_OR_DEPARTMENT_OTHER): Payer: Self-pay | Admitting: Urology

## 2020-11-17 ENCOUNTER — Other Ambulatory Visit: Payer: Self-pay

## 2020-11-17 NOTE — Progress Notes (Signed)
Spoke w/ via phone for pre-op interview---pt Lab needs dos----I stat                Lab results------ekg dr Arbie Cookey Larose Hires 08-05-2020 on chart COVID test ------11-18-2020  Arrive at -------915 am 11-22-2020 NPO after MN NO Solid Food.  Clear liquids from MN until---815 am then npo Med rec completed Medications to take morning of surgery -----metorpolol succinate, atorvastatin Diabetic medication -----n/a Patient instructed to bring photo id and insurance card day of surgery Patient aware to have Driver (ride ) / caregiver wife Benjamine Mola will stay    for 24 hours after surgery  Patient Special Instructions -----none Pre-Op special Istructions -----none Patient verbalized understanding of instructions that were given at this phone interview. Patient denies shortness of breath, chest pain, fever, cough at this phone interview.

## 2020-11-18 ENCOUNTER — Other Ambulatory Visit (HOSPITAL_COMMUNITY)
Admission: RE | Admit: 2020-11-18 | Discharge: 2020-11-18 | Disposition: A | Discharge status: Home / Self Care | Payer: Medicare Other | Source: Ambulatory Visit | Attending: Urology | Admitting: Urology

## 2020-11-18 DIAGNOSIS — Z01812 Encounter for preprocedural laboratory examination: Secondary | ICD-10-CM | POA: Insufficient documentation

## 2020-11-18 DIAGNOSIS — Z20822 Contact with and (suspected) exposure to covid-19: Secondary | ICD-10-CM | POA: Insufficient documentation

## 2020-11-18 LAB — SARS CORONAVIRUS 2 (TAT 6-24 HRS): SARS Coronavirus 2: NEGATIVE

## 2020-11-21 NOTE — H&P (Signed)
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  Past Medical History:  Diagnosis Date  . Bladder tumor   . Complication of anesthesia    prefers not to have spinal had spinal with piliodional cyst and felt bad with chills  . History of kidney stones 2000-11-07   passed on own  . Hyperlipidemia   . Hypertension   . Wears contact lenses   . Wears glasses     Past Surgical History:  Procedure Laterality Date  . COLONOSCOPY  07/2019   1 polyp removed  . PILONIDAL CYST EXCISION     5 x  . ruptured disc     1998 and 2000 neck bone graft done  . TONSILLECTOMY  age 20    adenoids also  . UPPER GASTROINTESTINAL ENDOSCOPY  Nov 08, 2002    Home Medications:  Allergies as of 11/21/2020   No Known Allergies     Medication List    Notice   Cannot display discharge medications because the patient has not yet been admitted.     Allergies: No Known Allergies  Family History  Adopted: Yes    Social History:  reports that he has never smoked. He has never used smokeless tobacco. He reports current alcohol use of about 4.0 standard drinks of alcohol per week. He reports that he does not use drugs.  ROS: A complete review of systems was performed.  All systems are negative except for pertinent findings as noted.  Physical Exam:  Vital signs in last 24 hours: Ht 5\' 10"  (1.778 m)   Wt 106.6 kg   BMI 33.72 kg/m  Constitutional:  Alert and oriented, No acute distress Cardiovascular: Regular rate  Respiratory: Normal respiratory effort GI: Abdomen is soft, nontender, nondistended, no abdominal masses. No CVAT.  Genitourinary: Normal male phallus, testes are descended bilaterally and non-tender and without masses, scrotum is normal in appearance without lesions or masses, perineum is normal on inspection. Lymphatic: No lymphadenopathy Neurologic: Grossly intact,  no focal deficits Psychiatric: Normal mood and affect  Laboratory Data:  No results for input(s): WBC, HGB, HCT, PLT in the last 72 hours.  No results for input(s): NA, K, CL, GLUCOSE, BUN, CALCIUM, CREATININE in the last 72 hours.  Invalid input(s): CO3   No results found for this or any previous visit (from the past 24 hour(s)). Recent Results (from the past 240 hour(s))  SARS CORONAVIRUS 2 (TAT 6-24 HRS) Nasopharyngeal Nasopharyngeal Swab     Status: None   Collection Time: 11/18/20 10:15 AM   Specimen: Nasopharyngeal Swab  Result Value Ref Range Status   SARS Coronavirus 2 NEGATIVE NEGATIVE Final    Comment: (NOTE) SARS-CoV-2 target nucleic acids are NOT DETECTED.  The SARS-CoV-2 RNA is generally detectable in upper and lower respiratory specimens during the acute phase of infection. Negative results do not preclude SARS-CoV-2 infection, do not rule out co-infections with other pathogens, and should not be used as the sole basis for treatment or other patient management decisions. Negative results must be combined with clinical observations, patient history, and epidemiological information. The expected result is Negative.  Fact Sheet for Patients: SugarRoll.be  Fact Sheet for Healthcare Providers: https://www.woods-mathews.com/  This test is not yet approved or cleared by the [place] FDA and  has been authorized for detection and/or diagnosis of SARS-CoV-2 by FDA under an Emergency Use Authorization (EUA). This EUA  will remain  in effect (meaning this test can be used) for the duration of the COVID-19 declaration under Se ction 564(b)(1) of the Act, 21 U.S.C. section 360bbb-3(b)(1), unless the authorization is terminated or revoked sooner.  Performed at [facility], [place] [address] [place], [place] [address]     Renal Function: No results for input(s): CREATININE in the last 168 hours. CrCl cannot be  calculated (No successful lab value found.).  Radiologic Imaging: No results found.  Impression/Assessment:  25 mm bladder tumor  Plan:  TURBT/gemcitabine admin

## 2020-11-22 ENCOUNTER — Encounter (HOSPITAL_BASED_OUTPATIENT_CLINIC_OR_DEPARTMENT_OTHER): Payer: Self-pay | Admitting: Urology

## 2020-11-22 ENCOUNTER — Encounter (HOSPITAL_BASED_OUTPATIENT_CLINIC_OR_DEPARTMENT_OTHER)
Admission: RE | Disposition: A | Discharge status: Home / Self Care | Payer: Self-pay | Source: Home / Self Care | Attending: Urology

## 2020-11-22 ENCOUNTER — Ambulatory Visit (HOSPITAL_BASED_OUTPATIENT_CLINIC_OR_DEPARTMENT_OTHER): Payer: Medicare Other | Admitting: Certified Registered"

## 2020-11-22 ENCOUNTER — Ambulatory Visit (HOSPITAL_BASED_OUTPATIENT_CLINIC_OR_DEPARTMENT_OTHER)
Admission: RE | Admit: 2020-11-22 | Discharge: 2020-11-22 | Disposition: A | Discharge status: Home / Self Care | Payer: Medicare Other | Attending: Urology | Admitting: Urology

## 2020-11-22 ENCOUNTER — Other Ambulatory Visit: Payer: Self-pay

## 2020-11-22 DIAGNOSIS — C670 Malignant neoplasm of trigone of bladder: Secondary | ICD-10-CM | POA: Insufficient documentation

## 2020-11-22 DIAGNOSIS — D090 Carcinoma in situ of bladder: Secondary | ICD-10-CM | POA: Diagnosis not present

## 2020-11-22 DIAGNOSIS — I10 Essential (primary) hypertension: Secondary | ICD-10-CM | POA: Insufficient documentation

## 2020-11-22 DIAGNOSIS — Z87442 Personal history of urinary calculi: Secondary | ICD-10-CM | POA: Diagnosis not present

## 2020-11-22 DIAGNOSIS — D494 Neoplasm of unspecified behavior of bladder: Secondary | ICD-10-CM | POA: Diagnosis not present

## 2020-11-22 DIAGNOSIS — C675 Malignant neoplasm of bladder neck: Secondary | ICD-10-CM

## 2020-11-22 DIAGNOSIS — E785 Hyperlipidemia, unspecified: Secondary | ICD-10-CM | POA: Diagnosis not present

## 2020-11-22 HISTORY — PX: TRANSURETHRAL RESECTION OF BLADDER TUMOR WITH MITOMYCIN-C: SHX6459

## 2020-11-22 HISTORY — DX: Other complications of anesthesia, initial encounter: T88.59XA

## 2020-11-22 HISTORY — DX: Neoplasm of unspecified behavior of bladder: D49.4

## 2020-11-22 HISTORY — DX: Presence of spectacles and contact lenses: Z97.3

## 2020-11-22 LAB — POCT I-STAT, CHEM 8
BUN: 9 mg/dL (ref 8–23)
Calcium, Ion: 1.27 mmol/L (ref 1.15–1.40)
Chloride: 98 mmol/L (ref 98–111)
Creatinine, Ser: 0.6 mg/dL — ABNORMAL LOW (ref 0.61–1.24)
Glucose, Bld: 100 mg/dL — ABNORMAL HIGH (ref 70–99)
HCT: 47 % (ref 39.0–52.0)
Hemoglobin: 16 g/dL (ref 13.0–17.0)
Potassium: 3.7 mmol/L (ref 3.5–5.1)
Sodium: 141 mmol/L (ref 135–145)
TCO2: 29 mmol/L (ref 22–32)

## 2020-11-22 SURGERY — TRANSURETHRAL RESECTION OF BLADDER TUMOR WITH MITOMYCIN-C
Anesthesia: General | Site: Bladder

## 2020-11-22 MED ORDER — PROPOFOL 10 MG/ML IV BOLUS
INTRAVENOUS | Status: AC
  Filled 2020-11-22: qty 20

## 2020-11-22 MED ORDER — AMISULPRIDE (ANTIEMETIC) 5 MG/2ML IV SOLN: 10.0000 mg | Freq: Once | INTRAVENOUS | Status: DC | PRN

## 2020-11-22 MED ORDER — SODIUM CHLORIDE 0.9 % IR SOLN
Status: DC | PRN
  Administered 2020-11-22: 4500 mL

## 2020-11-22 MED ORDER — CEFAZOLIN SODIUM-DEXTROSE 2-4 GM/100ML-% IV SOLN
2.0000 g | INTRAVENOUS | Status: AC
  Administered 2020-11-22: 2 g via INTRAVENOUS

## 2020-11-22 MED ORDER — DEXAMETHASONE SODIUM PHOSPHATE 10 MG/ML IJ SOLN
INTRAMUSCULAR | Status: AC
  Filled 2020-11-22: qty 1

## 2020-11-22 MED ORDER — CEFAZOLIN SODIUM-DEXTROSE 2-4 GM/100ML-% IV SOLN
INTRAVENOUS | Status: AC
  Filled 2020-11-22: qty 100

## 2020-11-22 MED ORDER — MIDAZOLAM HCL 5 MG/5ML IJ SOLN
INTRAMUSCULAR | Status: DC | PRN
  Administered 2020-11-22: 2 mg via INTRAVENOUS

## 2020-11-22 MED ORDER — MIDAZOLAM HCL 2 MG/2ML IJ SOLN
INTRAMUSCULAR | Status: AC
  Filled 2020-11-22: qty 2

## 2020-11-22 MED ORDER — ACETAMINOPHEN 500 MG PO TABS
1000.0000 mg | ORAL_TABLET | Freq: Once | ORAL | Status: AC
  Administered 2020-11-22: 1000 mg via ORAL

## 2020-11-22 MED ORDER — GEMCITABINE CHEMO FOR BLADDER INSTILLATION 2000 MG
2000.0000 mg | Freq: Once | INTRAVENOUS | Status: AC
  Administered 2020-11-22: 2000 mg via INTRAVESICAL
  Filled 2020-11-22: qty 2000

## 2020-11-22 MED ORDER — FENTANYL CITRATE (PF) 100 MCG/2ML IJ SOLN
INTRAMUSCULAR | Status: AC
  Filled 2020-11-22: qty 2

## 2020-11-22 MED ORDER — LIDOCAINE 2% (20 MG/ML) 5 ML SYRINGE
INTRAMUSCULAR | Status: AC
  Filled 2020-11-22: qty 5

## 2020-11-22 MED ORDER — LACTATED RINGERS IV SOLN: INTRAVENOUS | Status: DC

## 2020-11-22 MED ORDER — ONDANSETRON HCL 4 MG/2ML IJ SOLN
INTRAMUSCULAR | Status: AC
  Filled 2020-11-22: qty 2

## 2020-11-22 MED ORDER — LIDOCAINE 2% (20 MG/ML) 5 ML SYRINGE
INTRAMUSCULAR | Status: DC | PRN
  Administered 2020-11-22: 60 mg via INTRAVENOUS

## 2020-11-22 MED ORDER — FENTANYL CITRATE (PF) 100 MCG/2ML IJ SOLN: 25.0000 ug | INTRAMUSCULAR | Status: DC | PRN

## 2020-11-22 MED ORDER — CEPHALEXIN 500 MG PO CAPS: 500.0000 mg | ORAL_CAPSULE | Freq: Two times a day (BID) | ORAL | 0 refills | Status: AC

## 2020-11-22 MED ORDER — ONDANSETRON HCL 4 MG/2ML IJ SOLN
INTRAMUSCULAR | Status: DC | PRN
  Administered 2020-11-22: 4 mg via INTRAVENOUS

## 2020-11-22 MED ORDER — PROPOFOL 10 MG/ML IV BOLUS
INTRAVENOUS | Status: DC | PRN
  Administered 2020-11-22: 250 mg via INTRAVENOUS

## 2020-11-22 MED ORDER — ACETAMINOPHEN 500 MG PO TABS
ORAL_TABLET | ORAL | Status: AC
  Filled 2020-11-22: qty 2

## 2020-11-22 MED ORDER — FENTANYL CITRATE (PF) 100 MCG/2ML IJ SOLN
INTRAMUSCULAR | Status: DC | PRN
  Administered 2020-11-22: 50 ug via INTRAVENOUS
  Administered 2020-11-22: 25 ug via INTRAVENOUS

## 2020-11-22 MED ORDER — DEXAMETHASONE SODIUM PHOSPHATE 10 MG/ML IJ SOLN
INTRAMUSCULAR | Status: DC | PRN
  Administered 2020-11-22: 5 mg via INTRAVENOUS

## 2020-11-22 SURGICAL SUPPLY — 29 items
BAG DRAIN URO-CYSTO SKYTR STRL (DRAIN) ×2 IMPLANT
BAG DRN RND TRDRP ANRFLXCHMBR (UROLOGICAL SUPPLIES) ×1
BAG DRN UROCATH (DRAIN) ×1
BAG URINE DRAIN 2000ML AR STRL (UROLOGICAL SUPPLIES) ×1 IMPLANT
BAG URINE LEG 500ML (DRAIN) IMPLANT
CATH FOLEY 2WAY SLVR  5CC 20FR (CATHETERS) ×2
CATH FOLEY 2WAY SLVR  5CC 22FR (CATHETERS)
CATH FOLEY 2WAY SLVR 5CC 20FR (CATHETERS) IMPLANT
CATH FOLEY 2WAY SLVR 5CC 22FR (CATHETERS) IMPLANT
CATH FOLEY 3WAY 30CC 22FR (CATHETERS) IMPLANT
CLOTH BEACON ORANGE TIMEOUT ST (SAFETY) ×2 IMPLANT
ELECT REM PT RETURN 9FT ADLT (ELECTROSURGICAL)
ELECTRODE REM PT RTRN 9FT ADLT (ELECTROSURGICAL) ×1 IMPLANT
EVACUATOR MICROVAS BLADDER (UROLOGICAL SUPPLIES) IMPLANT
GLOVE SURG ENC MOIS LTX SZ8 (GLOVE) ×2 IMPLANT
GOWN STRL REUS W/TWL XL LVL3 (GOWN DISPOSABLE) ×2 IMPLANT
HOLDER FOLEY CATH W/STRAP (MISCELLANEOUS) ×1 IMPLANT
IV NS IRRIG 3000ML ARTHROMATIC (IV SOLUTION) ×3 IMPLANT
KIT TURNOVER CYSTO (KITS) ×2 IMPLANT
LOOP CUT BIPOLAR 24F LRG (ELECTROSURGICAL) ×2 IMPLANT
MANIFOLD NEPTUNE II (INSTRUMENTS) ×2 IMPLANT
NS IRRIG 500ML POUR BTL (IV SOLUTION) ×1 IMPLANT
PACK CYSTO (CUSTOM PROCEDURE TRAY) ×2 IMPLANT
PLUG CATH AND CAP STER (CATHETERS) IMPLANT
SYR TOOMEY IRRIG 70ML (MISCELLANEOUS) ×2
SYRINGE TOOMEY IRRIG 70ML (MISCELLANEOUS) ×1 IMPLANT
TUBE CONNECTING 12X1/4 (SUCTIONS) ×2 IMPLANT
TUBING UROLOGY SET (TUBING) IMPLANT
WATER STERILE IRR 500ML POUR (IV SOLUTION) ×1 IMPLANT

## 2020-11-22 NOTE — Transfer of Care (Signed)
Immediate Anesthesia Transfer of Care Note  Patient: [name] [name]  Procedure(s) Performed: TRANSURETHRAL RESECTION OF BLADDER TUMOR WITH POST OPERATIVE INSTILLATION OF GEMCITABINE (N/A Bladder)  Patient Location: PACU  Anesthesia Type:General  Level of Consciousness: drowsy  Airway & Oxygen Therapy: Patient Spontanous Breathing and Patient connected to face mask oxygen  Post-op Assessment: Report given to RN and Post -op Vital signs reviewed and stable  Post vital signs: Reviewed and stable  Last Vitals:  Vitals Value Taken Time  BP 125/86 11/22/20 1042  Temp    Pulse 59 11/22/20 1044  Resp 14 11/22/20 1044  SpO2 98 % 11/22/20 1044  Vitals shown include unvalidated device data.  Last Pain:  Vitals:   11/22/20 0913  TempSrc: Oral         Complications: No complications documented.

## 2020-11-22 NOTE — Interval H&P Note (Signed)
History and Physical Interval Note:  11/22/2020 9:41 AM  [name] [name]  has presented today for surgery, with the diagnosis of BLADDER TUMOR.  The various methods of treatment have been discussed with the patient and family. After consideration of risks, benefits and other options for treatment, the patient has consented to  Procedure(s): TRANSURETHRAL RESECTION OF BLADDER TUMOR WITH GEMCITABINE (N/A) as a surgical intervention.  The patient's history has been reviewed, patient examined, no change in status, stable for surgery.  I have reviewed the patient's chart and labs.  Questions were answered to the patient's satisfaction.     Lillette Boxer Quintan Saldivar

## 2020-11-22 NOTE — Anesthesia Procedure Notes (Signed)
Procedure Name: LMA Insertion Date/Time: 11/22/2020 10:06 AM Performed by: Gwyndolyn Saxon, CRNA Pre-anesthesia Checklist: Patient identified, Emergency Drugs available, Suction available and Patient being monitored Patient Re-evaluated:Patient Re-evaluated prior to induction Oxygen Delivery Method: Circle system utilized Preoxygenation: Pre-oxygenation with 100% oxygen Induction Type: IV induction Ventilation: Mask ventilation without difficulty and Oral airway inserted - appropriate to patient size LMA: LMA inserted LMA Size: 5.0 Number of attempts: 1 Placement Confirmation: positive ETCO2 and breath sounds checked- equal and bilateral Tube secured with: Tape Dental Injury: Teeth and Oropharynx as per pre-operative assessment  Comments: Glidescope available; pt with anterior airway and decreased neck movement from previous cervical fusions x2

## 2020-11-22 NOTE — Anesthesia Preprocedure Evaluation (Addendum)
Anesthesia Evaluation  Patient identified by MRN, date of birth, ID band Patient awake    Reviewed: Allergy & Precautions, NPO status , Patient's Chart, lab work & pertinent test results  Airway Mallampati: II  TM Distance: >3 FB Neck ROM: Full    Dental  (+) Dental Advisory Given   Pulmonary neg pulmonary ROS,    breath sounds clear to auscultation       Cardiovascular hypertension, Pt. on medications and Pt. on home beta blockers  Rhythm:Regular Rate:Normal     Neuro/Psych negative neurological ROS     GI/Hepatic negative GI ROS, Neg liver ROS,   Endo/Other  negative endocrine ROS  Renal/GU negative Renal ROS   Bladder tumor    Musculoskeletal   Abdominal   Peds  Hematology negative hematology ROS (+)   Anesthesia Other Findings   Reproductive/Obstetrics                             Anesthesia Physical Anesthesia Plan  ASA: II  Anesthesia Plan: General   Post-op Pain Management:    Induction: Intravenous  PONV Risk Score and Plan: 2 and Dexamethasone, Ondansetron and Treatment may vary due to age or medical condition  Airway Management Planned: LMA  Additional Equipment: None  Intra-op Plan:   Post-operative Plan: Extubation in OR  Informed Consent: I have reviewed the patients History and Physical, chart, labs and discussed the procedure including the risks, benefits and alternatives for the proposed anesthesia with the patient or authorized representative who has indicated his/her understanding and acceptance.     Dental advisory given  Plan Discussed with: CRNA  Anesthesia Plan Comments:        Anesthesia Quick Evaluation

## 2020-11-22 NOTE — Op Note (Signed)
Preoperative diagnosis: 2.5 cm bladder tumor in trigonal area  Postoperative diagnosis: Same  Principal procedure: TURBT of 2.5 cm trigonal tumor, placement of intravesical gemcitabine  Surgeon: Christophere Hillhouse  Anesthesia: General with LMA  Complications: None  Specimens: 1.  Bladder tumor 2.  Bladder tumor base  Estimated blood loss: None  Drains: 20 French Foley catheter  Indications: 65 year old male presents for TURBT of a 2.5 cm lesion located at the very lower part of the trigone.  I discussed the procedure with the patient, risks, complications as well as placement of gemcitabine intravesically postoperatively.  He understands these and desires to proceed.  Findings: Urethra was normal.  Prostate was nonobstructive.  There was a pedunculated tumor right at the bladder neck in the midline.  There were no other urothelial lesions.  Ureteral orifice ease were normal.  Description of procedure: The patient was properly identified in the holding area.  Was taken to the operating room where general anesthetic was administered with the LMA.  Is placed in the dorsolithotomy position.  Genitalia and perineum were prepped, draped, proper timeout performed.  37 French resectoscope sheath was placed using the visual obturator.  The above-mentioned findings were noted.  The bipolar loop was then placed.  The bladder tumor was easily resected as there was a fairly small stalk.  It was difficult to get the bladder tumor out as just the stalk was resected.  However, it came out in several pieces using the loop to trap the tumor within the resectoscope sheath.  Once the entire tumor was delivered from the bladder, I then resected the base of the bladder tumor.  This was sent labeled "bladder tumor base".  The resected base was then carefully coagulated.  There was adequate hemostasis at this point.  There being no other abnormalities in the bladder, the scope was removed.  1 French Foley catheter was  placed and the balloon filled with 10 cc of water.  Was hooked to dependent drainage.  The patient was then taken to the PACU in stable condition, having tolerated the procedure well.  In the PACU, 2 g of gemcitabine was instilled and left indwelling for an hour.  At that point, it was drained and the catheter hooked to dependent drainage.

## 2020-11-22 NOTE — Discharge Instructions (Signed)
1. You may see some blood in the urine and may have some burning with urination for 48-72 hours. You also may notice that you have to urinate more frequently or urgently after your procedure which is normal.  2. You should call should you develop an inability urinate, fever > 101, persistent nausea and vomiting that prevents you from eating or drinking to stay hydrated.  3. If you have a catheter, you will be taught how to take care of the catheter by the nursing staff prior to discharge from the hospital.  You may periodically feel a strong urge to void with the catheter in place.  This is a bladder spasm and most often can occur when having a bowel movement or moving around. It is typically self-limited and usually will stop after a few minutes.  You may use some Vaseline or Neosporin around the tip of the catheter to reduce friction at the tip of the penis. You may also see some blood in the urine.  A very small amount of blood can make the urine look quite red.  As long as the catheter is draining well, there usually is not a problem.  However, if the catheter is not draining well and is bloody, you should call the office [phone]) to notify us.  If the urine is fairly clear on Tuesday morning, you can remove the catheter as instructed by the nurses.    Transurethral Resection of Bladder Tumor, Care After This sheet gives you information about how to care for yourself after your procedure. Your health care provider may also give you more specific instructions. If you have problems or questions, contact your health care provider. What can I expect after the procedure? After the procedure, it is common to have:  A small amount of blood in your urine for up to 2 weeks.  Soreness or mild pain from your catheter. After your catheter is removed, you may have mild soreness, especially when urinating.  Pain in your lower abdomen. Follow these instructions at home: Medicines  Take over-the-counter  and prescription medicines only as told by your health care provider.  If you were prescribed an antibiotic medicine, take it as told by your health care provider. Do not stop taking the antibiotic even if you start to feel better.  Do not drive for 24 hours if you were given a sedative during your procedure.  Ask your health care provider if the medicine prescribed to you: ? Requires you to avoid driving or using heavy machinery. ? Can cause constipation. You may need to take these actions to prevent or treat constipation:  Take over-the-counter or prescription medicines.  Eat foods that are high in fiber, such as beans, whole grains, and fresh fruits and vegetables.  Limit foods that are high in fat and processed sugars, such as fried or sweet foods.   Activity  Return to your normal activities as told by your health care provider. Ask your health care provider what activities are safe for you.  Do not lift anything that is heavier than 10 lb (4.5 kg), or the limit that you are told, until your health care provider says that it is safe.  Avoid intense physical activity for as long as told by your health care provider.  Rest as told by your health care provider.  Avoid sitting for a long time without moving. Get up to take short walks every 1-2 hours. This is important to improve blood flow and breathing. Ask for  help if you feel weak or unsteady. General instructions  Do not drink alcohol for as long as told by your health care provider. This is especially important if you are taking prescription pain medicines.  Do not take baths, swim, or use a hot tub until your health care provider approves. Ask your health care provider if you may take showers. You may only be allowed to take sponge baths.  If you have a catheter, follow instructions from your health care provider about caring for your catheter and your drainage bag.  Drink enough fluid to keep your urine pale yellow.  Wear  compression stockings as told by your health care provider. These stockings help to prevent blood clots and reduce swelling in your legs.  Keep all follow-up visits as told by your health care provider. This is important. ? You will need to be followed closely with regular checks of your bladder and urethra (cystoscopies) to make sure that the cancer does not come back.   Contact a health care provider if:  You have pain that gets worse or does not improve with medicine.  You have blood in your urine for more than 2 weeks.  You have cloudy or bad-smelling urine.  You become constipated. Signs of constipation may include having: ? Fewer than three bowel movements in a week. ? Difficulty having a bowel movement. ? Stools that are dry, hard, or larger than normal.  You have a fever. Get help right away if:  You have: ? Severe pain. ? Bright red blood in your urine. ? Blood clots in your urine. ? A lot of blood in your urine.  Your catheter has been removed and you are not able to urinate.  You have a catheter in place and the catheter is not draining urine. Summary  After your procedure, it is common to have a small amount of blood in your urine, soreness or mild pain from your catheter, and pain in your lower abdomen.  Take over-the-counter and prescription medicines only as told by your health care provider.  Rest as told by your health care provider. Follow your health care provider's instructions about returning to normal activities. Ask what activities are safe for you.  If you have a catheter, follow instructions from your health care provider about caring for your catheter and your drainage bag.  Get help right away if you cannot urinate, you have severe pain, or you have bright red blood or blood clots in your urine. This information is not intended to replace advice given to you by your health care provider. Make sure you discuss any questions you have with your health  care provider. Document Revised: 02/28/2018 Document Reviewed: 02/28/2018 Elsevier Patient Education  2021 [place] Instructions  Activity: Get plenty of rest for the remainder of the day. A responsible individual must stay with you for 24 hours following the procedure.  For the next 24 hours, DO NOT: -Drive a car -Paediatric nurse -Drink alcoholic beverages -Take any medication unless instructed by your physician -Make any legal decisions or sign important papers.  Meals: Start with liquid foods such as gelatin or soup. Progress to regular foods as tolerated. Avoid greasy, spicy, heavy foods. If nausea and/or vomiting occur, drink only clear liquids until the nausea and/or vomiting subsides. Call your physician if vomiting continues.  Special Instructions/Symptoms: Your throat may feel dry or sore from the anesthesia or the breathing tube placed in your throat during  surgery. If this causes discomfort, gargle with warm salt water. The discomfort should disappear within 24 hours.   Indwelling Urinary Catheter Care, Adult An indwelling urinary catheter is a thin tube that is put into your bladder. The tube helps to drain pee (urine) out of your body. The tube goes in through your urethra. Your urethra is where pee comes out of your body. Your pee will come out through the catheter, then it will go into a bag (drainage bag). Take good care of your catheter so it will work well. How to wear your catheter and bag Supplies needed  Sticky tape (adhesive tape) or a leg strap.  Alcohol wipe or soap and water (if you use tape).  A clean towel (if you use tape).  Large overnight bag.  Smaller bag (leg bag). Wearing your catheter Attach your catheter to your leg with tape or a leg strap.  Make sure the catheter is not pulled tight.  If a leg strap gets wet, take it off and put on a dry strap.  If you use tape to hold the bag on your leg: 1. Use an  alcohol wipe or soap and water to wash your skin where the tape made it sticky before. 2. Use a clean towel to pat-dry that skin. 3. Use new tape to make the bag stay on your leg. Wearing your bags You should have been given a large overnight bag.  You may wear the overnight bag in the day or night.  Always have the overnight bag lower than your bladder.  Do not let the bag touch the floor.  Before you go to sleep, put a clean plastic bag in a wastebasket. Then hang the overnight bag inside the wastebasket. You should also have a smaller leg bag that fits under your clothes.  Always wear the leg bag below your knee.  Do not wear your leg bag at night. How to care for your skin and catheter Supplies needed  A clean washcloth.  Water and mild soap.  A clean towel. Caring for your skin and catheter  Clean the skin around your catheter every day: 1. Wash your hands with soap and water. 2. Wet a clean washcloth in warm water and mild soap. 3. Clean the skin around your urethra.  If you are male:  Gently spread the folds of skin around your vagina (labia).  With the washcloth in your other hand, wipe the inner side of your labia on each side. Wipe from front to back.  If you are male:  Pull back any skin that covers the end of your penis (foreskin).  With the washcloth in your other hand, wipe your penis in small circles. Start wiping at the tip of your penis, then move away from the catheter.  Move the foreskin back in place, if needed. 4. With your free hand, hold the catheter close to where it goes into your body.  Keep holding the catheter during cleaning so it does not get pulled out. 5. With the washcloth in your other hand, clean the catheter.  Only wipe downward on the catheter.  Do not wipe upward toward your body. Doing this may push germs into your urethra and cause infection. 6. Use a clean towel to pat-dry the catheter and the skin around it. Make sure to  wipe off all soap. 7. Wash your hands with soap and water.  Shower every day. Do not take baths.  Do not use cream, ointment, or  lotion on the area where the catheter goes into your body, unless your doctor tells you to.  Do not use powders, sprays, or lotions on your genital area.  Check your skin around the catheter every day for signs of infection. Check for: ? Redness, swelling, or pain. ? Fluid or blood. ? Warmth. ? Pus or a bad smell.      How to empty the bag Supplies needed  Rubbing alcohol.  Gauze pad or cotton ball.  Tape or a leg strap. Emptying the bag Pour the pee out of your bag when it is ?- full, or at least 2-3 times a day. Do this for your overnight bag and your leg bag. 1. Wash your hands with soap and water. 2. Separate (detach) the bag from your leg. 3. Hold the bag over the toilet or a clean pail. Keep the bag lower than your hips and bladder. This is so the pee (urine) does not go back into the tube. 4. Open the pour spout. It is at the bottom of the bag. 5. Empty the pee into the toilet or pail. Do not let the pour spout touch any surface. 6. Put rubbing alcohol on a gauze pad or cotton ball. 7. Use the gauze pad or cotton ball to clean the pour spout. 8. Close the pour spout. 9. Attach the bag to your leg with tape or a leg strap. 10. Wash your hands with soap and water. Follow instructions for cleaning the drainage bag:  From the product maker.  As told by your doctor. How to change the bag Supplies needed  Alcohol wipes.  A clean bag.  Tape or a leg strap. Changing the bag Replace your bag when it starts to leak, smell bad, or look dirty. 1. Wash your hands with soap and water. 2. Separate the dirty bag from your leg. 3. Pinch the catheter with your fingers so that pee does not spill out. 4. Separate the catheter tube from the bag tube where these tubes connect (at the connection valve). Do not let the tubes touch any  surface. 5. Clean the end of the catheter tube with an alcohol wipe. Use a different alcohol wipe to clean the end of the bag tube. 6. Connect the catheter tube to the tube of the clean bag. 7. Attach the clean bag to your leg with tape or a leg strap. Do not make the bag tight on your leg. 8. Wash your hands with soap and water. General rules  Never pull on your catheter. Never try to take it out. Doing that can hurt you.  Always wash your hands before and after you touch your catheter or bag. Use a mild, fragrance-free soap. If you do not have soap and water, use hand sanitizer.  Always make sure there are no twists or bends (kinks) in the catheter tube.  Always make sure there are no leaks in the catheter or bag.  Drink enough fluid to keep your pee pale yellow.  Do not take baths, swim, or use a hot tub.  If you are male, wipe from front to back after you poop (have a bowel movement).   Contact a doctor if:  Your pee is cloudy.  Your pee smells worse than usual.  Your catheter gets clogged.  Your catheter leaks.  Your bladder feels full. Get help right away if:  You have redness, swelling, or pain where the catheter goes into your body.  You have fluid, blood, pus,  or a bad smell coming from the area where the catheter goes into your body.  Your skin feels warm where the catheter goes into your body.  You have a fever.  You have pain in your: ? Belly (abdomen). ? Legs. ? Lower back. ? Bladder.  You see blood in the catheter.  Your pee is pink or red.  You feel sick to your stomach (nauseous).  You throw up (vomit).  You have chills.  Your pee is not draining into the bag.  Your catheter gets pulled out. Summary  An indwelling urinary catheter is a thin tube that is placed into the bladder to help drain pee (urine) out of the body.  The catheter is placed into the part of the body that drains pee from the bladder (urethra).  Taking good care of  your catheter will keep it working properly and help prevent problems.  Always wash your hands before and after touching your catheter or bag.  Never pull on your catheter or try to take it out. This information is not intended to replace advice given to you by your health care provider. Make sure you discuss any questions you have with your health care provider. Document Revised: 11/22/2018 Document Reviewed: 03/16/2017 Elsevier Patient Education  [place].

## 2020-11-23 LAB — SURGICAL PATHOLOGY

## 2020-11-23 NOTE — Anesthesia Postprocedure Evaluation (Signed)
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  Last Vitals:  Vitals:   11/22/20 1215 11/22/20 1340  BP: (!) 175/97 (!) 162/82  Pulse: (!) 58 67  Resp: 11 16  Temp: 36.6 C 36.7 C  SpO2: 96% 94%    Last Pain:  Vitals:   11/22/20 1340  TempSrc:   PainSc: 0-No pain                 Tiajuana Amass

## 2020-11-24 ENCOUNTER — Encounter (HOSPITAL_BASED_OUTPATIENT_CLINIC_OR_DEPARTMENT_OTHER): Payer: Self-pay | Admitting: Urology

## 2020-12-13 DIAGNOSIS — R310 Gross hematuria: Secondary | ICD-10-CM | POA: Diagnosis not present

## 2020-12-13 DIAGNOSIS — C670 Malignant neoplasm of trigone of bladder: Secondary | ICD-10-CM | POA: Diagnosis not present

## 2021-01-26 DIAGNOSIS — I10 Essential (primary) hypertension: Secondary | ICD-10-CM | POA: Diagnosis not present

## 2021-01-26 DIAGNOSIS — E611 Iron deficiency: Secondary | ICD-10-CM | POA: Diagnosis not present

## 2021-01-26 DIAGNOSIS — E538 Deficiency of other specified B group vitamins: Secondary | ICD-10-CM | POA: Diagnosis not present

## 2021-01-26 DIAGNOSIS — R5382 Chronic fatigue, unspecified: Secondary | ICD-10-CM | POA: Diagnosis not present

## 2021-01-26 DIAGNOSIS — E782 Mixed hyperlipidemia: Secondary | ICD-10-CM | POA: Diagnosis not present

## 2021-04-22 DIAGNOSIS — Z8551 Personal history of malignant neoplasm of bladder: Secondary | ICD-10-CM | POA: Diagnosis not present

## 2021-05-03 DIAGNOSIS — Z23 Encounter for immunization: Secondary | ICD-10-CM | POA: Diagnosis not present

## 2021-05-06 DIAGNOSIS — H5213 Myopia, bilateral: Secondary | ICD-10-CM | POA: Diagnosis not present

## 2021-05-06 DIAGNOSIS — H2513 Age-related nuclear cataract, bilateral: Secondary | ICD-10-CM | POA: Diagnosis not present

## 2021-05-06 DIAGNOSIS — H4423 Degenerative myopia, bilateral: Secondary | ICD-10-CM | POA: Diagnosis not present

## 2021-05-06 DIAGNOSIS — H10411 Chronic giant papillary conjunctivitis, right eye: Secondary | ICD-10-CM | POA: Diagnosis not present

## 2021-08-31 DIAGNOSIS — E782 Mixed hyperlipidemia: Secondary | ICD-10-CM | POA: Diagnosis not present

## 2021-08-31 DIAGNOSIS — Z0000 Encounter for general adult medical examination without abnormal findings: Secondary | ICD-10-CM | POA: Diagnosis not present

## 2021-08-31 DIAGNOSIS — I10 Essential (primary) hypertension: Secondary | ICD-10-CM | POA: Diagnosis not present

## 2021-08-31 DIAGNOSIS — R898 Other abnormal findings in specimens from other organs, systems and tissues: Secondary | ICD-10-CM | POA: Diagnosis not present

## 2021-08-31 DIAGNOSIS — Z23 Encounter for immunization: Secondary | ICD-10-CM | POA: Diagnosis not present

## 2021-08-31 DIAGNOSIS — Z8551 Personal history of malignant neoplasm of bladder: Secondary | ICD-10-CM | POA: Diagnosis not present

## 2021-09-06 DIAGNOSIS — D225 Melanocytic nevi of trunk: Secondary | ICD-10-CM | POA: Diagnosis not present

## 2021-09-06 DIAGNOSIS — Z23 Encounter for immunization: Secondary | ICD-10-CM | POA: Diagnosis not present

## 2021-09-06 DIAGNOSIS — L821 Other seborrheic keratosis: Secondary | ICD-10-CM | POA: Diagnosis not present

## 2021-09-06 DIAGNOSIS — L570 Actinic keratosis: Secondary | ICD-10-CM | POA: Diagnosis not present

## 2021-09-06 DIAGNOSIS — L578 Other skin changes due to chronic exposure to nonionizing radiation: Secondary | ICD-10-CM | POA: Diagnosis not present

## 2021-09-06 DIAGNOSIS — D2239 Melanocytic nevi of other parts of face: Secondary | ICD-10-CM | POA: Diagnosis not present

## 2021-09-06 DIAGNOSIS — D2261 Melanocytic nevi of right upper limb, including shoulder: Secondary | ICD-10-CM | POA: Diagnosis not present

## 2021-09-06 DIAGNOSIS — D2271 Melanocytic nevi of right lower limb, including hip: Secondary | ICD-10-CM | POA: Diagnosis not present

## 2021-10-21 DIAGNOSIS — Z8551 Personal history of malignant neoplasm of bladder: Secondary | ICD-10-CM | POA: Diagnosis not present

## 2022-01-10 DIAGNOSIS — I10 Essential (primary) hypertension: Secondary | ICD-10-CM | POA: Diagnosis not present

## 2022-04-25 DIAGNOSIS — Z23 Encounter for immunization: Secondary | ICD-10-CM | POA: Diagnosis not present

## 2022-05-08 DIAGNOSIS — H2513 Age-related nuclear cataract, bilateral: Secondary | ICD-10-CM | POA: Diagnosis not present

## 2022-05-08 DIAGNOSIS — H5213 Myopia, bilateral: Secondary | ICD-10-CM | POA: Diagnosis not present

## 2022-05-08 DIAGNOSIS — H10411 Chronic giant papillary conjunctivitis, right eye: Secondary | ICD-10-CM | POA: Diagnosis not present

## 2022-05-25 DIAGNOSIS — W19XXXA Unspecified fall, initial encounter: Secondary | ICD-10-CM | POA: Diagnosis not present

## 2022-05-25 DIAGNOSIS — M79672 Pain in left foot: Secondary | ICD-10-CM | POA: Diagnosis not present

## 2022-05-25 DIAGNOSIS — S91332A Puncture wound without foreign body, left foot, initial encounter: Secondary | ICD-10-CM | POA: Diagnosis not present

## 2022-06-09 DIAGNOSIS — Z4802 Encounter for removal of sutures: Secondary | ICD-10-CM | POA: Diagnosis not present

## 2022-06-09 DIAGNOSIS — S91332D Puncture wound without foreign body, left foot, subsequent encounter: Secondary | ICD-10-CM | POA: Diagnosis not present

## 2022-06-26 DIAGNOSIS — I10 Essential (primary) hypertension: Secondary | ICD-10-CM | POA: Diagnosis not present

## 2022-06-26 DIAGNOSIS — S91322S Laceration with foreign body, left foot, sequela: Secondary | ICD-10-CM | POA: Diagnosis not present

## 2022-06-30 ENCOUNTER — Ambulatory Visit: Payer: Medicare Other | Admitting: Podiatry

## 2022-06-30 DIAGNOSIS — S99922A Unspecified injury of left foot, initial encounter: Secondary | ICD-10-CM

## 2022-06-30 DIAGNOSIS — S92515A Nondisplaced fracture of proximal phalanx of left lesser toe(s), initial encounter for closed fracture: Secondary | ICD-10-CM

## 2022-06-30 MED ORDER — CEPHALEXIN 500 MG PO CAPS: 500.0000 mg | ORAL_CAPSULE | Freq: Four times a day (QID) | ORAL | 0 refills | Status: DC

## 2022-06-30 NOTE — Progress Notes (Signed)
Subjective:   Patient ID: [name] [name], male   DOB: 66 y.o.   MRN: [id number]   HPI No chief complaint on file.  66 year old male presents the office today for evaluation of left foot injury.  Patient states that he was on vacation at a bed and breakfast in the night he got out of the bathroom and fell down 2 wooden steps cutting his toe.  He was seen at the [facility] with a laceration was repaired and he was started on antibiotics.  Upon review the ER records he was given Rocephin as well as tetanus.  He was discharged with amoxicillin.  He followed by [facility] clinic and the sutures were removed.  Still gets pain and swelling to the toe.  Originally he was in what appeared to be a surgical shoe but he is wearing a regular shoe today.   Review of Systems  All other systems reviewed and are negative.  Past Medical History:  Diagnosis Date   Bladder tumor    Complication of anesthesia    prefers not to have spinal had spinal with piliodional cyst and felt bad with chills   History of kidney stones 08/29/2000   passed on own   Hyperlipidemia    Hypertension    Wears contact lenses    Wears glasses     Past Surgical History:  Procedure Laterality Date   COLONOSCOPY  07/2019   1 polyp removed   PILONIDAL CYST EXCISION     5 x   ruptured disc     1998 and 2000 neck bone graft done   TONSILLECTOMY  age 78    adenoids also   TRANSURETHRAL RESECTION OF BLADDER TUMOR WITH MITOMYCIN-C N/A 11/22/2020   Procedure: TRANSURETHRAL RESECTION OF BLADDER TUMOR WITH POST OPERATIVE INSTILLATION OF GEMCITABINE;  Surgeon: Marcine Matar, MD;  Location: [facility];  Service: Urology;  Laterality: N/A;   UPPER GASTROINTESTINAL ENDOSCOPY  08-29-2002     Current Outpatient Medications:    metoprolol succinate (TOPROL-XL) 50 MG 24 hr tablet, Take 50 mg by mouth daily., Disp: , Rfl:    valsartan-hydrochlorothiazide (DIOVAN-HCT) 320-12.5 MG tablet, Take 1 tablet by mouth daily.,  Disp: , Rfl:    atorvastatin (LIPITOR) 10 MG tablet, Take 10 mg by mouth daily., Disp: , Rfl:    Multiple Vitamin (MULTIVITAMIN) tablet, Take 1 tablet by mouth daily., Disp: , Rfl:    OVER THE COUNTER MEDICATION, Preservision, ards 2 eye vitamins, one capsule twice daily., Disp: , Rfl:    sildenafil (REVATIO) 20 MG tablet, Take 20 mg by mouth as needed., Disp: , Rfl:   No Known Allergies        Objective:  Physical Exam  General: AAO x3, NAD  Dermatological: There is localized edema and mild erythema to the hallux.  There is no open lesion or laceration.  Some macerated tissue present along the interspace.  There is no drainage or pus.  There is no fluctuation or crepitation.  Vascular: Dorsalis Pedis artery and Posterior Tibial artery pedal pulses are 2/4 bilateral with immedate capillary fill time. There is no pain with calf compression, swelling, warmth, erythema.   Neruologic: Grossly intact via light touch bilateral.   Musculoskeletal: Mild discomfort noted to the toe itself on the fifth digit.  No pain in the metatarsals or other digits.  Flexor, extensor tendons appear to be intact.  Muscular strength 5/5 in all groups tested bilateral.  Gait: Unassisted, Nonantalgic.  Assessment:   66 year old male with left foot injury, laceration     Plan:  -Treatment options discussed including all alternatives, risks, and complications -Etiology of symptoms were discussed -X-rays were obtained and reviewed with the patient.  There is some periosteal reaction noted on the fifth proximal phalanx. -The change in the x-ray could be result of the injury however given the laceration concern for possible infection.  We will restart antibiotics.  Prescribed cephalexin.  Recommend surgical shoe for offloading, elevation.  He did bring the x-rays were urgent care by the office so I can compare them.  Dry thoroughly between the toes.  Return for 2-3 weeks left toe injury.  Repeat  x-ray  Vivi Barrack DPM

## 2022-07-03 ENCOUNTER — Ambulatory Visit: Payer: Medicare Other | Admitting: Podiatry

## 2022-07-05 ENCOUNTER — Other Ambulatory Visit: Payer: Self-pay | Admitting: Podiatry

## 2022-07-05 MED ORDER — CEPHALEXIN 500 MG PO CAPS: 500.0000 mg | ORAL_CAPSULE | Freq: Four times a day (QID) | ORAL | 0 refills | Status: DC

## 2022-07-21 ENCOUNTER — Ambulatory Visit: Payer: Medicare Other | Admitting: Podiatry

## 2022-07-21 DIAGNOSIS — S99922D Unspecified injury of left foot, subsequent encounter: Secondary | ICD-10-CM

## 2022-07-21 DIAGNOSIS — S99922A Unspecified injury of left foot, initial encounter: Secondary | ICD-10-CM

## 2022-07-21 DIAGNOSIS — L03032 Cellulitis of left toe: Secondary | ICD-10-CM

## 2022-07-21 DIAGNOSIS — S92515A Nondisplaced fracture of proximal phalanx of left lesser toe(s), initial encounter for closed fracture: Secondary | ICD-10-CM | POA: Diagnosis not present

## 2022-07-21 NOTE — Progress Notes (Unsigned)
Subjective: 66 year old male presents the office with above concerns.  States he is doing better.  Not having the pain and swelling or redness that he was.  He finished the course of antibiotics on Saturday.  Is not reporting fevers or chills.  Objective: AAO x3, NAD DP/PT pulses palpable bilaterally, CRT less than 3 seconds On the left fifth toe there is still some edema present with erythema is much improved.  There is a laceration on the plantar, medial aspect the fifth toe has healed.  No drainage or pus.  There is no increased temperature.  No fluctuation or crepitation.  No ascending cellulitis. No pain with calf compression, swelling, warmth, erythema  Assessment: 66 year old male fifth toe injury  Plan: -All treatment options discussed with the patient including all alternatives, risks, complications.  -X-rays obtained reviewed.  3 views of foot were obtained.  There are some osseous reaction noted on the proximal phalanx compared to the initial x-rays but about stable compared to last x-rays. -I do think the x-ray changes are result of the injury.  However given the open fracture and plan to check blood work to rule out any underlying infection.  He can gradually transition to regular shoe as tolerated.  I showed him how to take the toe to help with compression to help with the swelling. -Patient encouraged to call the office with any questions, concerns, change in symptoms.   Trula Slade DPM

## 2022-07-23 LAB — CBC WITH DIFFERENTIAL/PLATELET
Basophils Absolute: 0.1 10*3/uL (ref 0.0–0.2)
Basos: 1 %
EOS (ABSOLUTE): 0.4 10*3/uL (ref 0.0–0.4)
Eos: 4 %
Hematocrit: 45.2 % (ref 37.5–51.0)
Hemoglobin: 15.9 g/dL (ref 13.0–17.7)
Immature Grans (Abs): 0 10*3/uL (ref 0.0–0.1)
Immature Granulocytes: 0 %
Lymphocytes Absolute: 2.2 10*3/uL (ref 0.7–3.1)
Lymphs: 24 %
MCH: 29.9 pg (ref 26.6–33.0)
MCHC: 35.2 g/dL (ref 31.5–35.7)
MCV: 85 fL (ref 79–97)
Monocytes Absolute: 1 10*3/uL — ABNORMAL HIGH (ref 0.1–0.9)
Monocytes: 11 %
Neutrophils Absolute: 5.4 10*3/uL (ref 1.4–7.0)
Neutrophils: 60 %
RBC: 5.31 x10E6/uL (ref 4.14–5.80)
RDW: 12.5 % (ref 11.6–15.4)
WBC: 9.2 10*3/uL (ref 3.4–10.8)

## 2022-07-23 LAB — SEDIMENTATION RATE: Sed Rate: 2 mm/hr (ref 0–30)

## 2022-07-23 LAB — C-REACTIVE PROTEIN: CRP: <1 mg/L (ref 0–10)

## 2022-08-30 DIAGNOSIS — I10 Essential (primary) hypertension: Secondary | ICD-10-CM | POA: Diagnosis not present

## 2022-09-05 ENCOUNTER — Ambulatory Visit: Payer: Medicare Other | Admitting: Podiatry

## 2022-09-05 DIAGNOSIS — S92515D Nondisplaced fracture of proximal phalanx of left lesser toe(s), subsequent encounter for fracture with routine healing: Secondary | ICD-10-CM | POA: Diagnosis not present

## 2022-09-05 DIAGNOSIS — S99922D Unspecified injury of left foot, subsequent encounter: Secondary | ICD-10-CM

## 2022-09-06 DIAGNOSIS — L570 Actinic keratosis: Secondary | ICD-10-CM | POA: Diagnosis not present

## 2022-09-06 DIAGNOSIS — D2239 Melanocytic nevi of other parts of face: Secondary | ICD-10-CM | POA: Diagnosis not present

## 2022-09-06 DIAGNOSIS — B078 Other viral warts: Secondary | ICD-10-CM | POA: Diagnosis not present

## 2022-09-06 DIAGNOSIS — L578 Other skin changes due to chronic exposure to nonionizing radiation: Secondary | ICD-10-CM | POA: Diagnosis not present

## 2022-09-06 DIAGNOSIS — L821 Other seborrheic keratosis: Secondary | ICD-10-CM | POA: Diagnosis not present

## 2022-09-06 DIAGNOSIS — D2261 Melanocytic nevi of right upper limb, including shoulder: Secondary | ICD-10-CM | POA: Diagnosis not present

## 2022-09-06 DIAGNOSIS — D225 Melanocytic nevi of trunk: Secondary | ICD-10-CM | POA: Diagnosis not present

## 2022-09-07 NOTE — Progress Notes (Signed)
Subjective: Chief Complaint  Patient presents with   Toe Injury    Pt states toe I feeling much better , but there is some swelling . He states he is using a compression tape every day and take off during night time     67 year old male presents the office with above concerns.  States he is doing well overall much improved.  Some slight swelling still present but no redness or warmth.  He does keep the wrap on the toe which helps.  Not having any issues and is wearing regular shoe gear.  No fevers or chills.    Objective: AAO x3, NAD DP/PT pulses palpable bilaterally, CRT less than 3 seconds On the left fifth toe there is still some slight edema present but there is no erythema or any warmth.  There is a laceration appears to be healed.  There is no increased temperature or any obvious signs of infection.  Toe is in rectus position. No pain with calf compression, swelling, warmth, erythema  Assessment: 67 year old male fifth toe injury, improving  Plan: -All treatment options discussed with the patient including all alternatives, risks, complications.  -Can see improvement there is no clinical signs of infection.  I do think to take some time for the edema to completely resolve.  Continue with tube if she is avoiding rubbing. -Monitor for any clinical signs or symptoms of infection and directed to call the office immediately should any occur or go to the ER.  Return if symptoms worsen or fail to improve.  Trula Slade DPM

## 2022-09-15 DIAGNOSIS — I10 Essential (primary) hypertension: Secondary | ICD-10-CM | POA: Diagnosis not present

## 2022-09-15 DIAGNOSIS — R898 Other abnormal findings in specimens from other organs, systems and tissues: Secondary | ICD-10-CM | POA: Diagnosis not present

## 2022-09-15 DIAGNOSIS — Z79899 Other long term (current) drug therapy: Secondary | ICD-10-CM | POA: Diagnosis not present

## 2022-09-15 DIAGNOSIS — M5136 Other intervertebral disc degeneration, lumbar region: Secondary | ICD-10-CM | POA: Diagnosis not present

## 2022-09-15 DIAGNOSIS — Z0000 Encounter for general adult medical examination without abnormal findings: Secondary | ICD-10-CM | POA: Diagnosis not present

## 2022-09-15 DIAGNOSIS — K5730 Diverticulosis of large intestine without perforation or abscess without bleeding: Secondary | ICD-10-CM | POA: Diagnosis not present

## 2022-09-15 DIAGNOSIS — Z1159 Encounter for screening for other viral diseases: Secondary | ICD-10-CM | POA: Diagnosis not present

## 2022-09-15 DIAGNOSIS — E782 Mixed hyperlipidemia: Secondary | ICD-10-CM | POA: Diagnosis not present

## 2022-09-15 DIAGNOSIS — C679 Malignant neoplasm of bladder, unspecified: Secondary | ICD-10-CM | POA: Diagnosis not present

## 2022-09-15 DIAGNOSIS — I700 Atherosclerosis of aorta: Secondary | ICD-10-CM | POA: Diagnosis not present

## 2022-09-15 DIAGNOSIS — Z9181 History of falling: Secondary | ICD-10-CM | POA: Diagnosis not present

## 2023-02-02 DIAGNOSIS — Z8551 Personal history of malignant neoplasm of bladder: Secondary | ICD-10-CM | POA: Diagnosis not present

## 2023-02-23 DIAGNOSIS — M25551 Pain in right hip: Secondary | ICD-10-CM | POA: Diagnosis not present

## 2023-02-23 DIAGNOSIS — M25511 Pain in right shoulder: Secondary | ICD-10-CM | POA: Diagnosis not present

## 2023-03-16 DIAGNOSIS — I10 Essential (primary) hypertension: Secondary | ICD-10-CM | POA: Diagnosis not present

## 2023-03-16 DIAGNOSIS — E782 Mixed hyperlipidemia: Secondary | ICD-10-CM | POA: Diagnosis not present

## 2023-03-21 DIAGNOSIS — M19011 Primary osteoarthritis, right shoulder: Secondary | ICD-10-CM | POA: Diagnosis not present

## 2023-03-21 DIAGNOSIS — M1611 Unilateral primary osteoarthritis, right hip: Secondary | ICD-10-CM | POA: Diagnosis not present

## 2023-04-02 DIAGNOSIS — M25651 Stiffness of right hip, not elsewhere classified: Secondary | ICD-10-CM | POA: Diagnosis not present

## 2023-04-02 DIAGNOSIS — M19011 Primary osteoarthritis, right shoulder: Secondary | ICD-10-CM | POA: Diagnosis not present

## 2023-04-02 DIAGNOSIS — R531 Weakness: Secondary | ICD-10-CM | POA: Diagnosis not present

## 2023-04-02 DIAGNOSIS — M25611 Stiffness of right shoulder, not elsewhere classified: Secondary | ICD-10-CM | POA: Diagnosis not present

## 2023-04-09 DIAGNOSIS — M25611 Stiffness of right shoulder, not elsewhere classified: Secondary | ICD-10-CM | POA: Diagnosis not present

## 2023-04-09 DIAGNOSIS — R531 Weakness: Secondary | ICD-10-CM | POA: Diagnosis not present

## 2023-04-09 DIAGNOSIS — M25651 Stiffness of right hip, not elsewhere classified: Secondary | ICD-10-CM | POA: Diagnosis not present

## 2023-04-09 DIAGNOSIS — M19011 Primary osteoarthritis, right shoulder: Secondary | ICD-10-CM | POA: Diagnosis not present

## 2023-04-11 DIAGNOSIS — M19011 Primary osteoarthritis, right shoulder: Secondary | ICD-10-CM | POA: Diagnosis not present

## 2023-04-11 DIAGNOSIS — M25651 Stiffness of right hip, not elsewhere classified: Secondary | ICD-10-CM | POA: Diagnosis not present

## 2023-04-11 DIAGNOSIS — M25611 Stiffness of right shoulder, not elsewhere classified: Secondary | ICD-10-CM | POA: Diagnosis not present

## 2023-04-11 DIAGNOSIS — R531 Weakness: Secondary | ICD-10-CM | POA: Diagnosis not present

## 2023-04-17 DIAGNOSIS — M25651 Stiffness of right hip, not elsewhere classified: Secondary | ICD-10-CM | POA: Diagnosis not present

## 2023-04-17 DIAGNOSIS — M25611 Stiffness of right shoulder, not elsewhere classified: Secondary | ICD-10-CM | POA: Diagnosis not present

## 2023-04-17 DIAGNOSIS — M19011 Primary osteoarthritis, right shoulder: Secondary | ICD-10-CM | POA: Diagnosis not present

## 2023-04-17 DIAGNOSIS — R531 Weakness: Secondary | ICD-10-CM | POA: Diagnosis not present

## 2023-04-18 DIAGNOSIS — M25551 Pain in right hip: Secondary | ICD-10-CM | POA: Diagnosis not present

## 2023-04-18 DIAGNOSIS — M19011 Primary osteoarthritis, right shoulder: Secondary | ICD-10-CM | POA: Diagnosis not present

## 2023-04-19 DIAGNOSIS — M25611 Stiffness of right shoulder, not elsewhere classified: Secondary | ICD-10-CM | POA: Diagnosis not present

## 2023-04-19 DIAGNOSIS — R531 Weakness: Secondary | ICD-10-CM | POA: Diagnosis not present

## 2023-04-19 DIAGNOSIS — M19011 Primary osteoarthritis, right shoulder: Secondary | ICD-10-CM | POA: Diagnosis not present

## 2023-04-19 DIAGNOSIS — M25651 Stiffness of right hip, not elsewhere classified: Secondary | ICD-10-CM | POA: Diagnosis not present

## 2023-04-23 DIAGNOSIS — R531 Weakness: Secondary | ICD-10-CM | POA: Diagnosis not present

## 2023-04-23 DIAGNOSIS — M25611 Stiffness of right shoulder, not elsewhere classified: Secondary | ICD-10-CM | POA: Diagnosis not present

## 2023-04-23 DIAGNOSIS — M19011 Primary osteoarthritis, right shoulder: Secondary | ICD-10-CM | POA: Diagnosis not present

## 2023-04-23 DIAGNOSIS — M25651 Stiffness of right hip, not elsewhere classified: Secondary | ICD-10-CM | POA: Diagnosis not present

## 2023-04-25 DIAGNOSIS — M25611 Stiffness of right shoulder, not elsewhere classified: Secondary | ICD-10-CM | POA: Diagnosis not present

## 2023-04-25 DIAGNOSIS — R531 Weakness: Secondary | ICD-10-CM | POA: Diagnosis not present

## 2023-04-25 DIAGNOSIS — M25651 Stiffness of right hip, not elsewhere classified: Secondary | ICD-10-CM | POA: Diagnosis not present

## 2023-04-25 DIAGNOSIS — M19011 Primary osteoarthritis, right shoulder: Secondary | ICD-10-CM | POA: Diagnosis not present

## 2023-04-30 DIAGNOSIS — M25611 Stiffness of right shoulder, not elsewhere classified: Secondary | ICD-10-CM | POA: Diagnosis not present

## 2023-04-30 DIAGNOSIS — R531 Weakness: Secondary | ICD-10-CM | POA: Diagnosis not present

## 2023-04-30 DIAGNOSIS — M25651 Stiffness of right hip, not elsewhere classified: Secondary | ICD-10-CM | POA: Diagnosis not present

## 2023-04-30 DIAGNOSIS — M19011 Primary osteoarthritis, right shoulder: Secondary | ICD-10-CM | POA: Diagnosis not present

## 2023-05-02 DIAGNOSIS — R531 Weakness: Secondary | ICD-10-CM | POA: Diagnosis not present

## 2023-05-02 DIAGNOSIS — M25611 Stiffness of right shoulder, not elsewhere classified: Secondary | ICD-10-CM | POA: Diagnosis not present

## 2023-05-02 DIAGNOSIS — M19011 Primary osteoarthritis, right shoulder: Secondary | ICD-10-CM | POA: Diagnosis not present

## 2023-05-02 DIAGNOSIS — M25651 Stiffness of right hip, not elsewhere classified: Secondary | ICD-10-CM | POA: Diagnosis not present

## 2023-05-07 DIAGNOSIS — R531 Weakness: Secondary | ICD-10-CM | POA: Diagnosis not present

## 2023-05-07 DIAGNOSIS — M25651 Stiffness of right hip, not elsewhere classified: Secondary | ICD-10-CM | POA: Diagnosis not present

## 2023-05-07 DIAGNOSIS — M25611 Stiffness of right shoulder, not elsewhere classified: Secondary | ICD-10-CM | POA: Diagnosis not present

## 2023-05-07 DIAGNOSIS — M19011 Primary osteoarthritis, right shoulder: Secondary | ICD-10-CM | POA: Diagnosis not present

## 2023-05-08 DIAGNOSIS — Z23 Encounter for immunization: Secondary | ICD-10-CM | POA: Diagnosis not present

## 2023-05-09 DIAGNOSIS — M19011 Primary osteoarthritis, right shoulder: Secondary | ICD-10-CM | POA: Diagnosis not present

## 2023-05-09 DIAGNOSIS — M25651 Stiffness of right hip, not elsewhere classified: Secondary | ICD-10-CM | POA: Diagnosis not present

## 2023-05-09 DIAGNOSIS — M1611 Unilateral primary osteoarthritis, right hip: Secondary | ICD-10-CM | POA: Diagnosis not present

## 2023-05-09 DIAGNOSIS — R531 Weakness: Secondary | ICD-10-CM | POA: Diagnosis not present

## 2023-05-15 DIAGNOSIS — H5213 Myopia, bilateral: Secondary | ICD-10-CM | POA: Diagnosis not present

## 2023-05-15 DIAGNOSIS — H2513 Age-related nuclear cataract, bilateral: Secondary | ICD-10-CM | POA: Diagnosis not present

## 2023-05-17 DIAGNOSIS — S81811A Laceration without foreign body, right lower leg, initial encounter: Secondary | ICD-10-CM | POA: Diagnosis not present

## 2023-09-20 DIAGNOSIS — E782 Mixed hyperlipidemia: Secondary | ICD-10-CM | POA: Diagnosis not present

## 2023-09-20 DIAGNOSIS — I700 Atherosclerosis of aorta: Secondary | ICD-10-CM | POA: Diagnosis not present

## 2023-09-20 DIAGNOSIS — I10 Essential (primary) hypertension: Secondary | ICD-10-CM | POA: Diagnosis not present

## 2023-09-20 DIAGNOSIS — Z8551 Personal history of malignant neoplasm of bladder: Secondary | ICD-10-CM | POA: Diagnosis not present

## 2023-09-20 DIAGNOSIS — Z79899 Other long term (current) drug therapy: Secondary | ICD-10-CM | POA: Diagnosis not present

## 2023-09-20 DIAGNOSIS — Z0000 Encounter for general adult medical examination without abnormal findings: Secondary | ICD-10-CM | POA: Diagnosis not present

## 2023-09-20 DIAGNOSIS — Z1159 Encounter for screening for other viral diseases: Secondary | ICD-10-CM | POA: Diagnosis not present

## 2023-10-12 DIAGNOSIS — D225 Melanocytic nevi of trunk: Secondary | ICD-10-CM | POA: Diagnosis not present

## 2023-10-12 DIAGNOSIS — L570 Actinic keratosis: Secondary | ICD-10-CM | POA: Diagnosis not present

## 2023-10-12 DIAGNOSIS — L308 Other specified dermatitis: Secondary | ICD-10-CM | POA: Diagnosis not present

## 2023-10-12 DIAGNOSIS — L821 Other seborrheic keratosis: Secondary | ICD-10-CM | POA: Diagnosis not present

## 2023-10-12 DIAGNOSIS — D2261 Melanocytic nevi of right upper limb, including shoulder: Secondary | ICD-10-CM | POA: Diagnosis not present

## 2023-10-12 DIAGNOSIS — D2239 Melanocytic nevi of other parts of face: Secondary | ICD-10-CM | POA: Diagnosis not present

## 2023-10-12 DIAGNOSIS — D171 Benign lipomatous neoplasm of skin and subcutaneous tissue of trunk: Secondary | ICD-10-CM | POA: Diagnosis not present

## 2023-10-12 DIAGNOSIS — L578 Other skin changes due to chronic exposure to nonionizing radiation: Secondary | ICD-10-CM | POA: Diagnosis not present

## 2024-02-20 DIAGNOSIS — C670 Malignant neoplasm of trigone of bladder: Secondary | ICD-10-CM | POA: Diagnosis not present

## 2024-02-20 DIAGNOSIS — Z8551 Personal history of malignant neoplasm of bladder: Secondary | ICD-10-CM | POA: Diagnosis not present

## 2024-03-13 DIAGNOSIS — E782 Mixed hyperlipidemia: Secondary | ICD-10-CM | POA: Diagnosis not present

## 2024-03-13 DIAGNOSIS — I10 Essential (primary) hypertension: Secondary | ICD-10-CM | POA: Diagnosis not present

## 2024-04-13 DIAGNOSIS — I10 Essential (primary) hypertension: Secondary | ICD-10-CM | POA: Diagnosis not present

## 2024-04-13 DIAGNOSIS — E782 Mixed hyperlipidemia: Secondary | ICD-10-CM | POA: Diagnosis not present

## 2024-05-12 DIAGNOSIS — Z23 Encounter for immunization: Secondary | ICD-10-CM | POA: Diagnosis not present

## 2024-05-13 DIAGNOSIS — I10 Essential (primary) hypertension: Secondary | ICD-10-CM | POA: Diagnosis not present

## 2024-05-13 DIAGNOSIS — E782 Mixed hyperlipidemia: Secondary | ICD-10-CM | POA: Diagnosis not present
